# Patient Record
Sex: Male | Born: 1950
Health system: Southern US, Community
[De-identification: ages and names within clinical notes are randomized; demographics above are authoritative.]

## PROBLEM LIST (undated history)

## (undated) DIAGNOSIS — E7800 Pure hypercholesterolemia, unspecified: Secondary | ICD-10-CM

## (undated) DIAGNOSIS — C61 Malignant neoplasm of prostate: Secondary | ICD-10-CM

## (undated) DIAGNOSIS — H3530 Unspecified macular degeneration: Secondary | ICD-10-CM

## (undated) DIAGNOSIS — M1990 Unspecified osteoarthritis, unspecified site: Secondary | ICD-10-CM

## (undated) DIAGNOSIS — K759 Inflammatory liver disease, unspecified: Secondary | ICD-10-CM

## (undated) DIAGNOSIS — Z720 Tobacco use: Secondary | ICD-10-CM

## (undated) DIAGNOSIS — F419 Anxiety disorder, unspecified: Secondary | ICD-10-CM

## (undated) HISTORY — PX: OTHER SURGICAL HISTORY: SHX169

## (undated) HISTORY — PX: SHOULDER SURGERY: SHX246

## (undated) HISTORY — PX: EYE SURGERY: SHX253

## (undated) HISTORY — PX: APPENDECTOMY: SHX54

---

## 1998-06-20 ENCOUNTER — Ambulatory Visit (HOSPITAL_COMMUNITY): Admission: RE | Admit: 1998-06-20 | Discharge: 1998-06-20 | Payer: Self-pay | Admitting: Neurosurgery

## 1998-06-20 ENCOUNTER — Encounter: Payer: Self-pay | Admitting: Neurosurgery

## 1998-07-15 ENCOUNTER — Encounter: Payer: Self-pay | Admitting: Neurosurgery

## 1998-07-15 ENCOUNTER — Ambulatory Visit (HOSPITAL_COMMUNITY): Admission: RE | Admit: 1998-07-15 | Discharge: 1998-07-15 | Payer: Self-pay | Admitting: Neurosurgery

## 1998-07-29 ENCOUNTER — Ambulatory Visit (HOSPITAL_COMMUNITY): Admission: RE | Admit: 1998-07-29 | Discharge: 1998-07-29 | Payer: Self-pay | Admitting: Neurosurgery

## 1998-07-29 ENCOUNTER — Encounter: Payer: Self-pay | Admitting: Neurosurgery

## 1998-08-12 ENCOUNTER — Encounter: Payer: Self-pay | Admitting: Neurosurgery

## 1998-08-12 ENCOUNTER — Ambulatory Visit (HOSPITAL_COMMUNITY): Admission: RE | Admit: 1998-08-12 | Discharge: 1998-08-12 | Payer: Self-pay | Admitting: Neurosurgery

## 1998-10-01 ENCOUNTER — Ambulatory Visit (HOSPITAL_COMMUNITY): Admission: RE | Admit: 1998-10-01 | Discharge: 1998-11-25 | Payer: Self-pay | Admitting: Neurosurgery

## 1998-10-01 ENCOUNTER — Encounter: Payer: Self-pay | Admitting: Neurosurgery

## 1999-04-10 ENCOUNTER — Encounter: Payer: Self-pay | Admitting: Neurosurgery

## 1999-04-10 ENCOUNTER — Ambulatory Visit (HOSPITAL_COMMUNITY): Admission: RE | Admit: 1999-04-10 | Discharge: 1999-04-10 | Payer: Self-pay | Admitting: Neurosurgery

## 1999-05-20 ENCOUNTER — Encounter: Admission: RE | Admit: 1999-05-20 | Discharge: 1999-08-18 | Payer: Self-pay | Admitting: Neurosurgery

## 1999-06-24 ENCOUNTER — Ambulatory Visit (HOSPITAL_COMMUNITY): Admission: RE | Admit: 1999-06-24 | Discharge: 1999-06-24 | Payer: Self-pay | Admitting: Neurosurgery

## 1999-06-24 ENCOUNTER — Encounter: Payer: Self-pay | Admitting: Neurosurgery

## 2003-03-04 ENCOUNTER — Encounter: Payer: Self-pay | Admitting: Emergency Medicine

## 2003-03-04 ENCOUNTER — Emergency Department (HOSPITAL_COMMUNITY): Admission: EM | Admit: 2003-03-04 | Discharge: 2003-03-04 | Payer: Self-pay | Admitting: Emergency Medicine

## 2003-03-19 ENCOUNTER — Emergency Department (HOSPITAL_COMMUNITY): Admission: EM | Admit: 2003-03-19 | Discharge: 2003-03-19 | Payer: Self-pay | Admitting: Emergency Medicine

## 2003-03-19 ENCOUNTER — Encounter: Payer: Self-pay | Admitting: Emergency Medicine

## 2003-04-01 ENCOUNTER — Encounter: Payer: Self-pay | Admitting: Family Medicine

## 2003-04-01 ENCOUNTER — Ambulatory Visit (HOSPITAL_COMMUNITY): Admission: RE | Admit: 2003-04-01 | Discharge: 2003-04-01 | Payer: Self-pay | Admitting: Family Medicine

## 2010-05-07 ENCOUNTER — Ambulatory Visit (HOSPITAL_COMMUNITY): Admission: RE | Admit: 2010-05-07 | Discharge: 2010-05-07 | Payer: Self-pay | Admitting: Urology

## 2010-05-07 IMAGING — CT CT ABD-PEL WO/W CM
3 of 11 series · 12 of 46 positions shown, 18 images · IV contrast (Omnipaque 300)
Comparison: None.

CLINICAL DATA: Right flank and abdominal pain.  Elevated PSA.

CT ABDOMEN AND PELVIS WITHOUT AND WITH CONTRAST
TECHNIQUE: Multidetector CT imaging of the abdomen and pelvis was
performed without contrast material in one or both body regions,
followed by contrast material(s) and further sections in one or
both body regions.
Contrast: One and 25 ml [PR].

[Series 2: abd_pel_wo 5.0 b40f · axial · 0.74mm/px · z∈[-456,-106]mm · 7 of 94 slices shown, 12 images]
[im 12/94  soft-tissue]
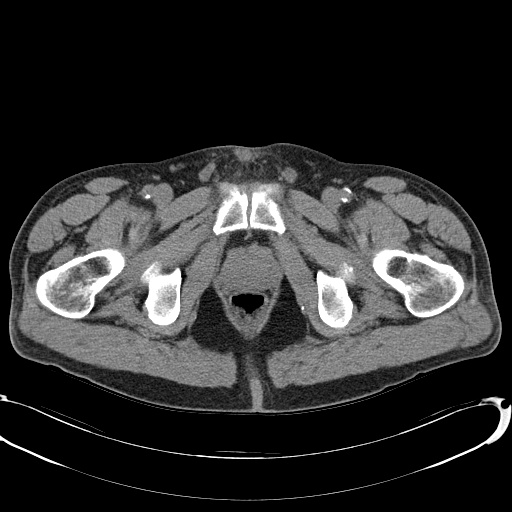
[im 12/94  bone]
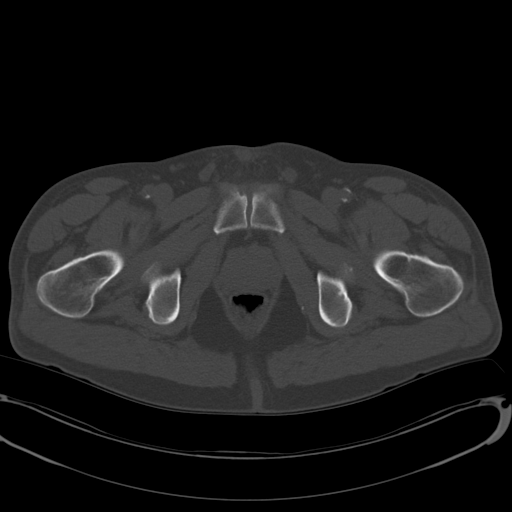
[im 24/94  soft-tissue]
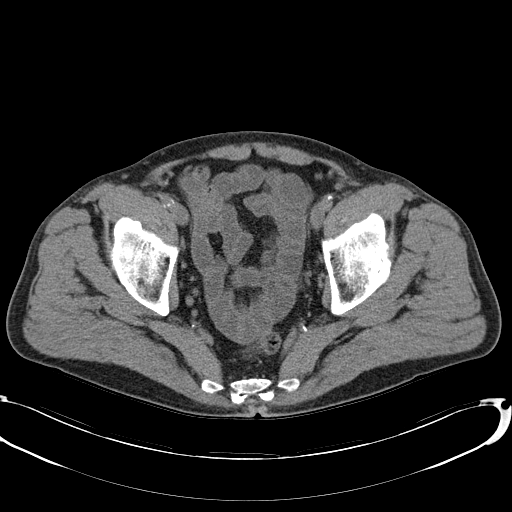
[im 35/94  soft-tissue]
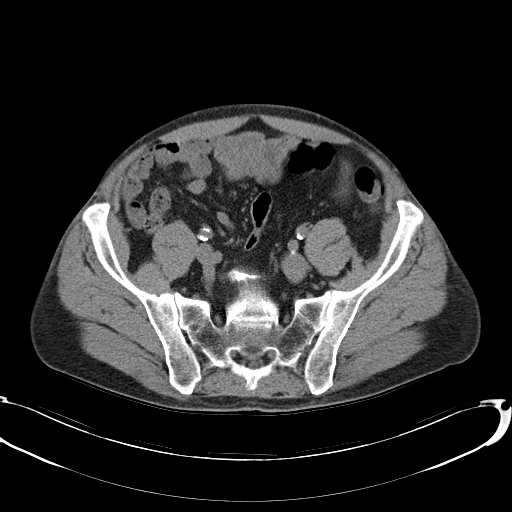
[im 47/94  soft-tissue]
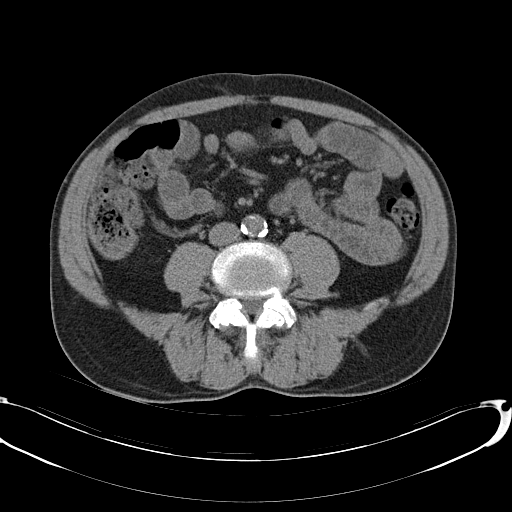
[im 47/94  lung]
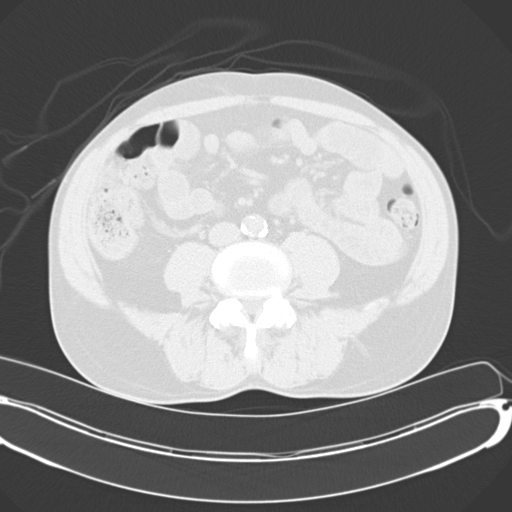
[im 59/94  soft-tissue]
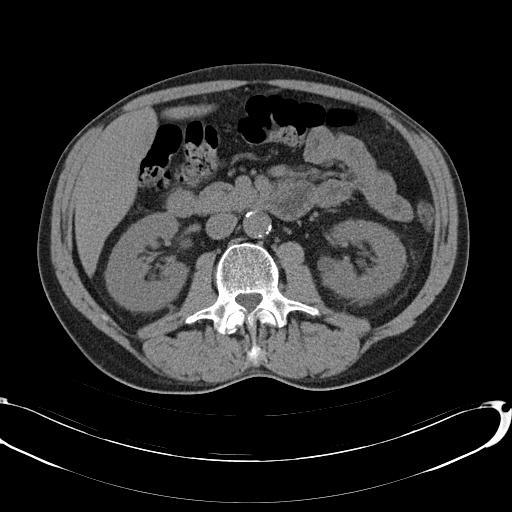
[im 59/94  lung]
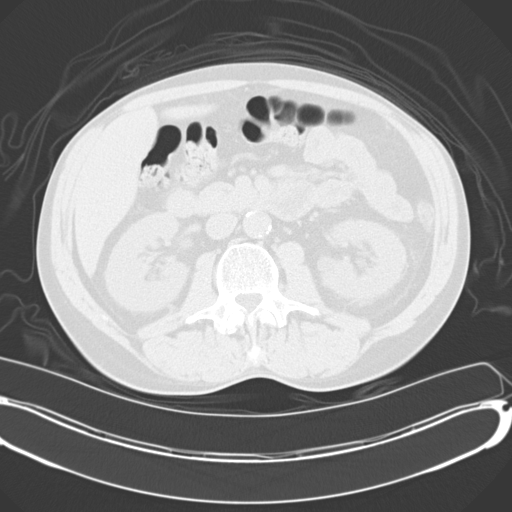
[im 70/94  soft-tissue]
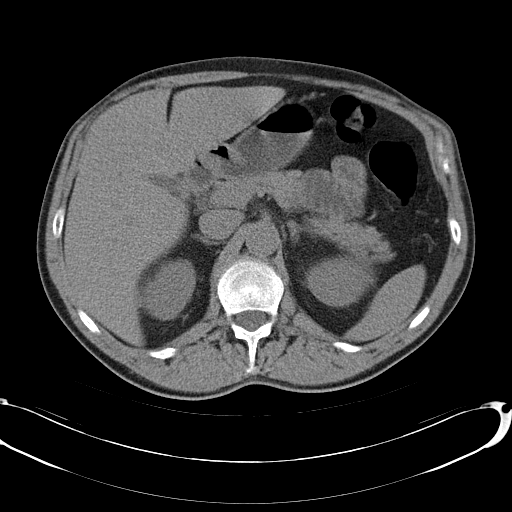
[im 70/94  lung]
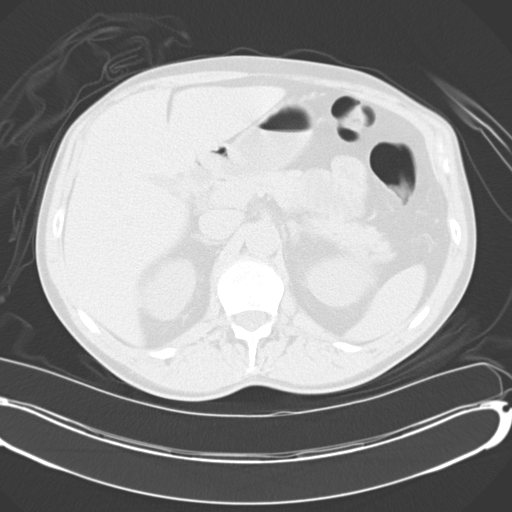
[im 82/94  soft-tissue]
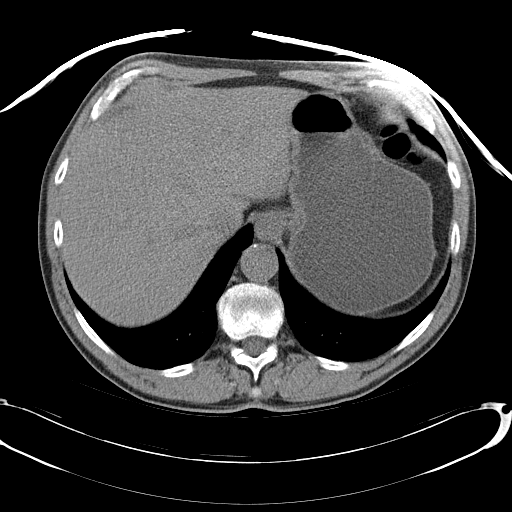
[im 82/94  lung]
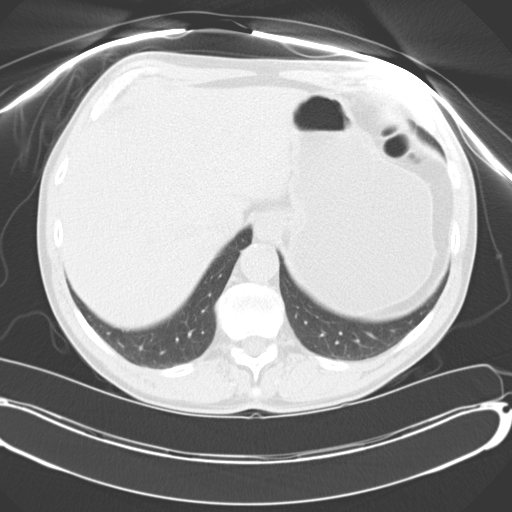

[Series 3: mpr pre contrast coronal 3.0 · coronal · non-contrast · 0.71mm/px · 2 of 83 slices shown, 3 images]
[im 28/83  soft-tissue]
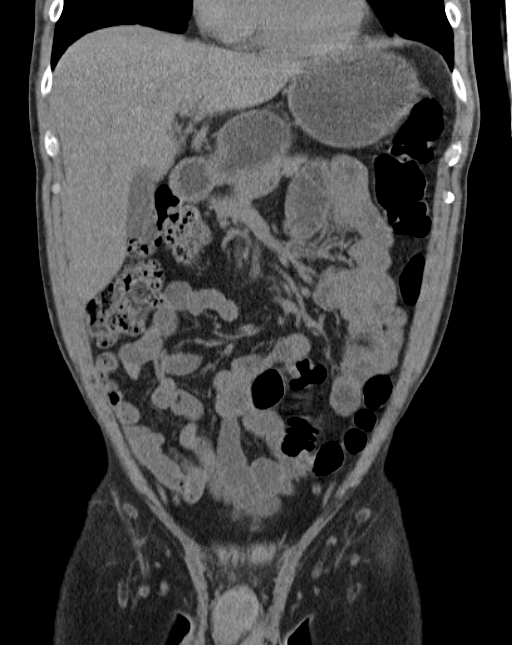
[im 28/83  bone]
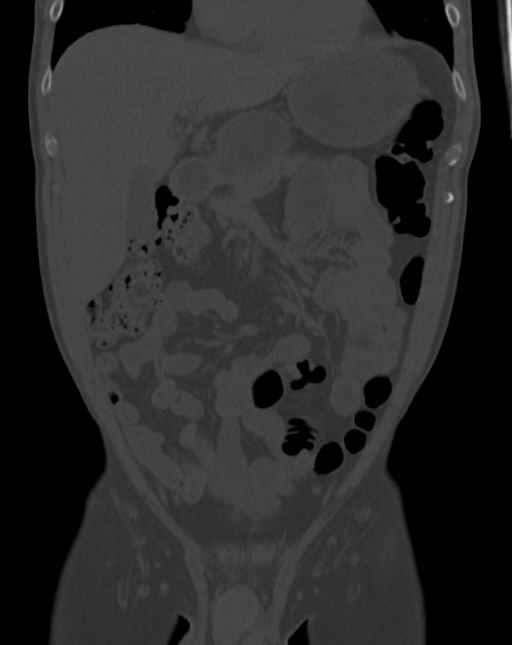
[im 55/83  soft-tissue]
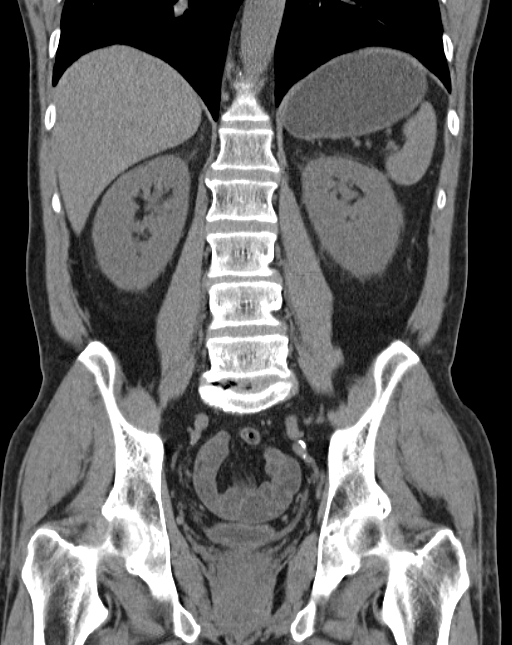

[Series 5: abd_pel_with 5.0 b40f · axial · 0.74mm/px · z∈[-449,-329]mm · 3 of 97 slices shown]
[im 13/97  soft-tissue]
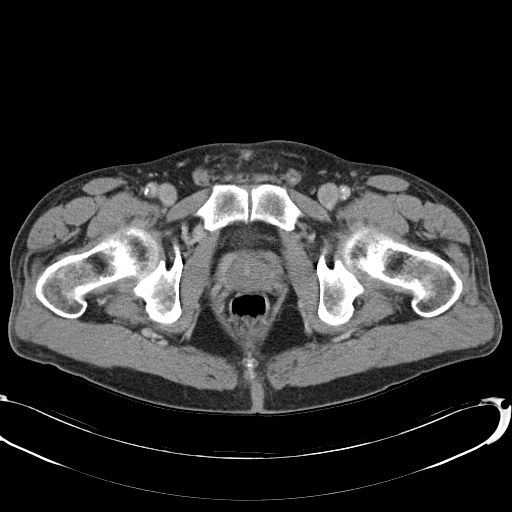
[im 25/97  soft-tissue]
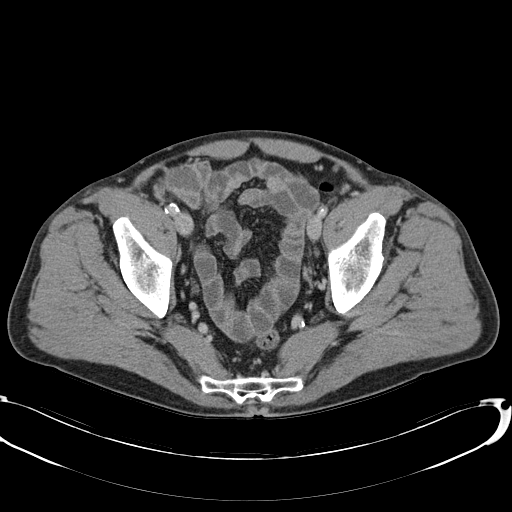
[im 37/97  soft-tissue]
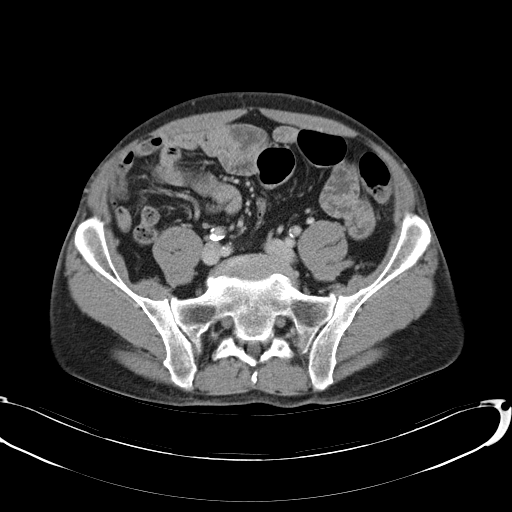

[12 of 46 positions shown; findings below may reference images not displayed]

FINDINGS: Mild dependent atelectasis is seen in the lung bases.  No
pleural or pericardial effusion.

There are no renal or ureteral stones and no hydronephrosis.  The
patient has three small left  renal cysts and a small right renal
cyst.  The kidneys otherwise appear normal.  There is good
opacification of the ureters which also appear normal.

The gallbladder, spleen, adrenal glands and pancreas appear normal.
A 0.5 cm hypoattenuating lesion in the right lobe of the liver is
compatible with a small cyst.  The liver is otherwise normal
appearance.

The stomach and small and large bowel are normal in appearance.
Urinary bladder and prostate gland appear normal.  There is no
lymphadenopathy or fluid.  No focal bony abnormality.
IMPRESSION: No acute finding.  Negative for urinary tract stones.  Small
bilateral renal cysts are noted.  The examination is otherwise
unremarkable.

## 2012-12-31 ENCOUNTER — Observation Stay (HOSPITAL_COMMUNITY)
Admission: EM | Admit: 2012-12-31 | Discharge: 2013-01-01 | Disposition: A | Discharge status: Home / Self Care | Payer: 59 | Attending: Internal Medicine | Admitting: Internal Medicine

## 2012-12-31 ENCOUNTER — Emergency Department (HOSPITAL_COMMUNITY): Payer: 59

## 2012-12-31 ENCOUNTER — Encounter (HOSPITAL_COMMUNITY): Payer: Self-pay | Admitting: *Deleted

## 2012-12-31 DIAGNOSIS — F4320 Adjustment disorder, unspecified: Secondary | ICD-10-CM | POA: Diagnosis present

## 2012-12-31 DIAGNOSIS — F17200 Nicotine dependence, unspecified, uncomplicated: Secondary | ICD-10-CM | POA: Insufficient documentation

## 2012-12-31 DIAGNOSIS — R079 Chest pain, unspecified: Principal | ICD-10-CM | POA: Diagnosis present

## 2012-12-31 DIAGNOSIS — R739 Hyperglycemia, unspecified: Secondary | ICD-10-CM | POA: Diagnosis present

## 2012-12-31 DIAGNOSIS — Z720 Tobacco use: Secondary | ICD-10-CM | POA: Diagnosis present

## 2012-12-31 DIAGNOSIS — R7309 Other abnormal glucose: Secondary | ICD-10-CM | POA: Insufficient documentation

## 2012-12-31 DIAGNOSIS — F4321 Adjustment disorder with depressed mood: Secondary | ICD-10-CM | POA: Insufficient documentation

## 2012-12-31 HISTORY — DX: Tobacco use: Z72.0

## 2012-12-31 LAB — BASIC METABOLIC PANEL
BUN: 9 mg/dL (ref 6–23)
Chloride: 96 mEq/L (ref 96–112)
GFR calc Af Amer: >90 mL/min (ref >90)
GFR calc non Af Amer: >90 mL/min (ref >90)
Potassium: 3.5 mEq/L (ref 3.5–5.1)
Sodium: 134 mEq/L — ABNORMAL LOW (ref 135–145)

## 2012-12-31 LAB — APTT: aPTT: 31 seconds (ref 24–37)

## 2012-12-31 LAB — TROPONIN I: Troponin I: <0.3 ng/mL (ref <0.30)

## 2012-12-31 LAB — CBC WITH DIFFERENTIAL/PLATELET
Basophils Relative: 0 % (ref 0–1)
Eosinophils Absolute: 0.3 10*3/uL (ref 0.0–0.7)
Eosinophils Relative: 3 % (ref 0–5)
Hemoglobin: 15.3 g/dL (ref 13.0–17.0)
Lymphocytes Relative: 28 % (ref 12–46)
Monocytes Absolute: 0.7 10*3/uL (ref 0.1–1.0)
Neutrophils Relative %: 62 % (ref 43–77)
Platelets: 266 10*3/uL (ref 150–400)
RBC: 4.54 MIL/uL (ref 4.22–5.81)

## 2012-12-31 LAB — PROTIME-INR
INR: 0.96 (ref 0.00–1.49)
Prothrombin Time: 12.7 seconds (ref 11.6–15.2)

## 2012-12-31 IMAGING — DX DG CHEST 1V PORT
1 series · 1 of 1 positions shown · non-contrast
Comparison: None.

CLINICAL DATA: Chest pain and shortness of breath.

PORTABLE CHEST - 1 VIEW

[portable]
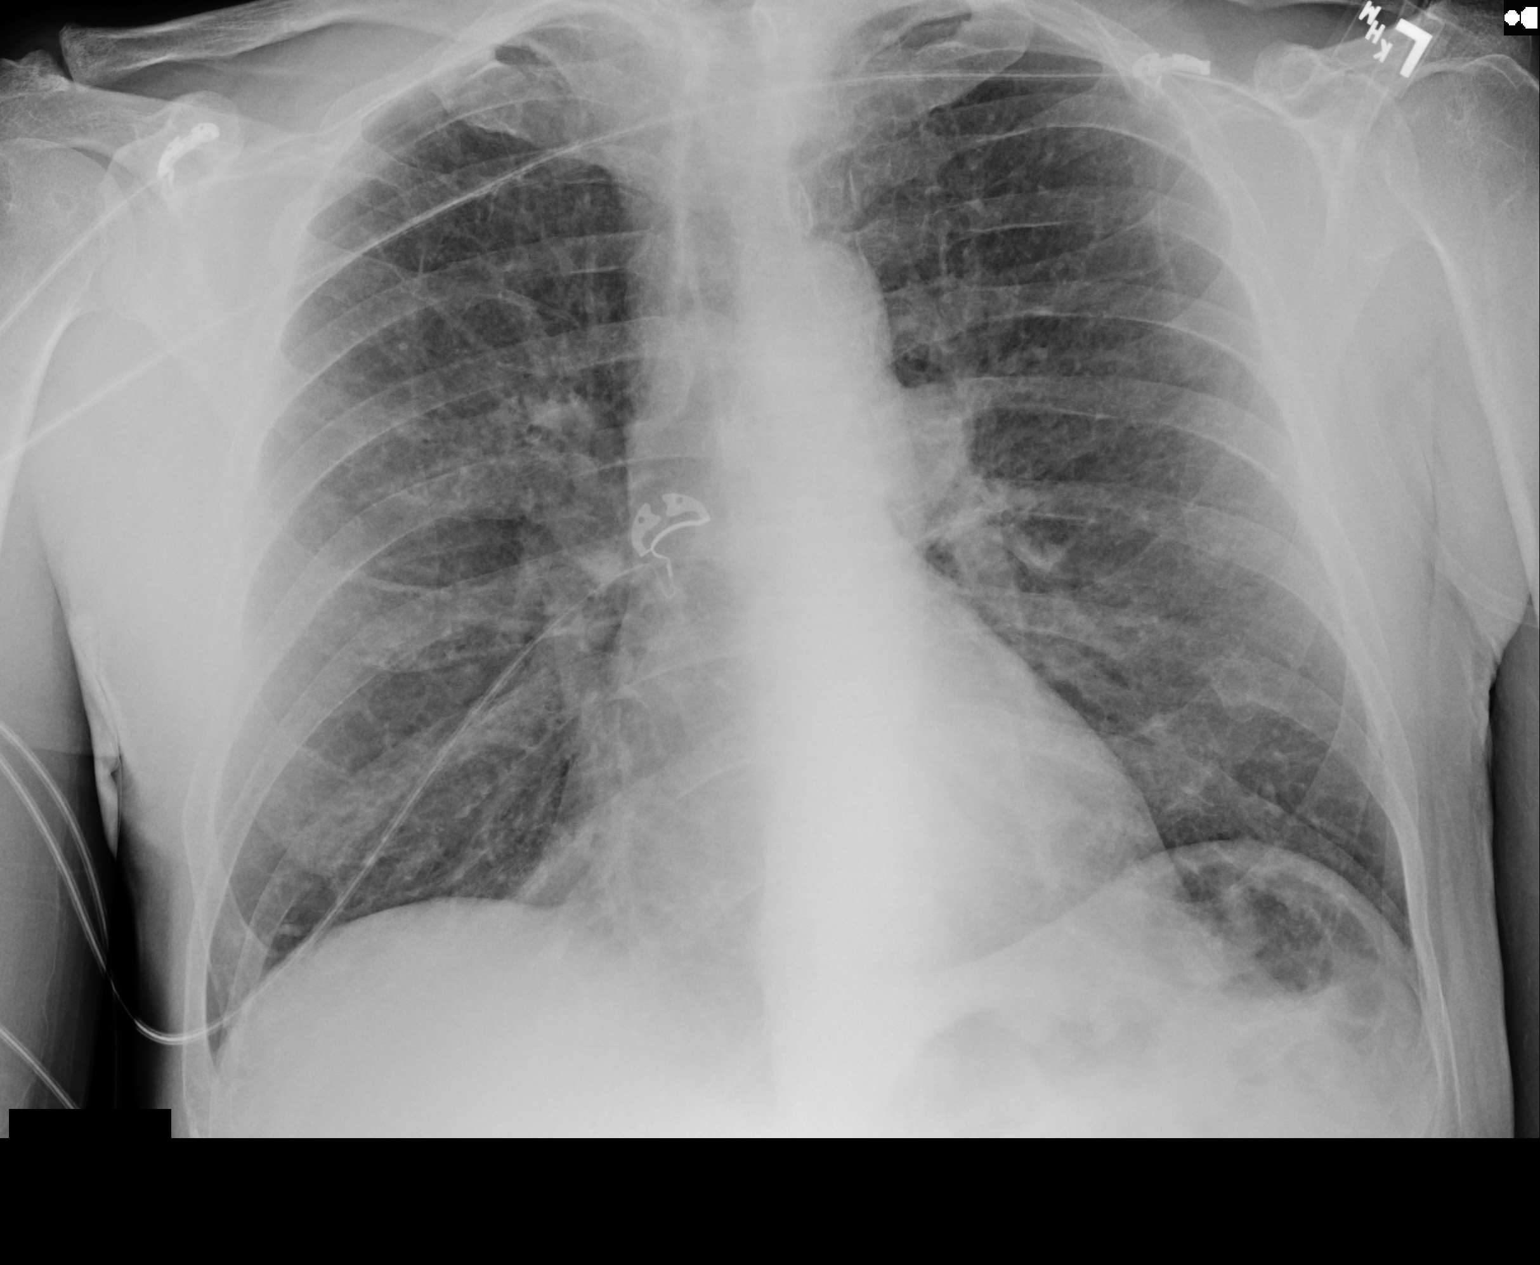

[1 of 1 positions shown; findings below may reference images not displayed]

FINDINGS: Diffuse interstitial prominence likely reflects chronic
lung disease.  No evidence of edema, pulmonary consolidation or
pleural effusion.  Heart size and mediastinal contours are within
normal limits.
IMPRESSION: No acute findings.  Diffuse interstitial prominence likely reflects
component of chronic lung disease.

## 2012-12-31 MED ORDER — ACETAMINOPHEN 325 MG PO TABS
650.0000 mg | ORAL_TABLET | Freq: Four times a day (QID) | ORAL | Status: DC | PRN
  Administered 2012-12-31: 650 mg via ORAL
  Filled 2012-12-31: qty 2

## 2012-12-31 MED ORDER — ONDANSETRON HCL 4 MG/2ML IJ SOLN: 4.0000 mg | Freq: Four times a day (QID) | INTRAMUSCULAR | Status: DC | PRN

## 2012-12-31 MED ORDER — ALBUTEROL SULFATE (5 MG/ML) 0.5% IN NEBU: 2.5000 mg | INHALATION_SOLUTION | RESPIRATORY_TRACT | Status: DC | PRN

## 2012-12-31 MED ORDER — ENOXAPARIN SODIUM 100 MG/ML ~~LOC~~ SOLN
1.0000 mg/kg | Freq: Once | SUBCUTANEOUS | Status: AC
  Administered 2012-12-31: 75 mg via SUBCUTANEOUS
  Filled 2012-12-31: qty 1

## 2012-12-31 MED ORDER — ASPIRIN EC 81 MG PO TBEC
81.0000 mg | DELAYED_RELEASE_TABLET | Freq: Every day | ORAL | Status: DC
  Administered 2013-01-01: 81 mg via ORAL
  Filled 2012-12-31: qty 1

## 2012-12-31 MED ORDER — ENOXAPARIN SODIUM 40 MG/0.4ML ~~LOC~~ SOLN
40.0000 mg | SUBCUTANEOUS | Status: DC
  Administered 2012-12-31: 40 mg via SUBCUTANEOUS
  Filled 2012-12-31: qty 0.4

## 2012-12-31 MED ORDER — NICOTINE 21 MG/24HR TD PT24
21.0000 mg | MEDICATED_PATCH | Freq: Every day | TRANSDERMAL | Status: DC
  Administered 2012-12-31 – 2013-01-01 (×2): 21 mg via TRANSDERMAL
  Filled 2012-12-31 (×2): qty 1

## 2012-12-31 MED ORDER — GUAIFENESIN-DM 100-10 MG/5ML PO SYRP: 5.0000 mL | ORAL_SOLUTION | ORAL | Status: DC | PRN

## 2012-12-31 MED ORDER — NITROGLYCERIN 2 % TD OINT
0.5000 [in_us] | TOPICAL_OINTMENT | Freq: Four times a day (QID) | TRANSDERMAL | Status: DC
  Administered 2012-12-31 – 2013-01-01 (×3): 0.5 [in_us] via TOPICAL
  Filled 2012-12-31 (×3): qty 1

## 2012-12-31 MED ORDER — ASPIRIN 325 MG PO TABS
325.0000 mg | ORAL_TABLET | Freq: Once | ORAL | Status: AC
  Administered 2012-12-31: 325 mg via ORAL
  Filled 2012-12-31: qty 1

## 2012-12-31 MED ORDER — ALPRAZOLAM 0.5 MG PO TABS
0.5000 mg | ORAL_TABLET | Freq: Three times a day (TID) | ORAL | Status: DC | PRN
  Administered 2012-12-31 (×2): 0.5 mg via ORAL
  Filled 2012-12-31 (×2): qty 1

## 2012-12-31 MED ORDER — HYDROMORPHONE HCL PF 1 MG/ML IJ SOLN: 0.5000 mg | INTRAMUSCULAR | Status: DC | PRN

## 2012-12-31 MED ORDER — ACETAMINOPHEN 650 MG RE SUPP: 650.0000 mg | Freq: Four times a day (QID) | RECTAL | Status: DC | PRN

## 2012-12-31 MED ORDER — MORPHINE SULFATE 4 MG/ML IJ SOLN
6.0000 mg | Freq: Once | INTRAMUSCULAR | Status: AC
  Administered 2012-12-31: 6 mg via INTRAVENOUS
  Filled 2012-12-31: qty 2

## 2012-12-31 MED ORDER — ONDANSETRON HCL 4 MG PO TABS: 4.0000 mg | ORAL_TABLET | Freq: Four times a day (QID) | ORAL | Status: DC | PRN

## 2012-12-31 MED ORDER — POTASSIUM CHLORIDE IN NACL 20-0.9 MEQ/L-% IV SOLN
INTRAVENOUS | Status: DC
  Administered 2012-12-31 – 2013-01-01 (×2): via INTRAVENOUS
  Filled 2012-12-31: qty 1000

## 2012-12-31 MED ORDER — FAMOTIDINE 20 MG PO TABS
20.0000 mg | ORAL_TABLET | Freq: Two times a day (BID) | ORAL | Status: DC
  Administered 2013-01-01: 20 mg via ORAL
  Filled 2012-12-31 (×2): qty 1

## 2012-12-31 MED ORDER — NITROGLYCERIN 2 % TD OINT
0.5000 [in_us] | TOPICAL_OINTMENT | Freq: Four times a day (QID) | TRANSDERMAL | Status: DC
  Administered 2012-12-31: 0.5 [in_us] via TOPICAL
  Filled 2012-12-31: qty 1

## 2012-12-31 MED ORDER — NITROGLYCERIN 0.4 MG SL SUBL
0.4000 mg | SUBLINGUAL_TABLET | SUBLINGUAL | Status: DC | PRN
  Administered 2012-12-31 (×3): 0.4 mg via SUBLINGUAL
  Filled 2012-12-31: qty 25

## 2012-12-31 MED ORDER — ALUM & MAG HYDROXIDE-SIMETH 200-200-20 MG/5ML PO SUSP: 30.0000 mL | Freq: Four times a day (QID) | ORAL | Status: DC | PRN

## 2012-12-31 NOTE — H&P (Signed)
[facility] History and Physical  [name]:[id number] DOB: 1950/10/06 DOA: 12/31/2012  Referring physician: Dr. Patria Mane PCP: Cassell Smiles., MD  Specialists: None  Chief Complaint: Chest pain  HPI: David White is a 62 y.o. male with no significant past medical history, who presents to the emergency department today with a chief complaint of chest pain. His chest pain has been intermittent and has been occurring for at least 2-3 months. It was worse yesterday and this morning. It is located in the mid chest to the left chest. He describes the pain as tightness and heaviness. At its worse, is an 8/10 in intensity. It is intermittent with rest and with activity. Nothing makes the pain better or worse. It generally resolves spontaneously. He has taken aspirin on a few occasions, but he is not sure if it helped to relieve the pain or if the pain was relieved on its own. He has had occasional shortness of breath, occasional radiation to both sides of his jaw, and intermittent nausea. He has had some cramping in his calf muscles, but no swelling in his legs. He denies heavy lifting. He denies pleurisy. He acknowledges some depression and stress as his wife suddenly and unexpectedly died approximately 3 weeks ago of a massive heart attack.  In the emergency department, the patient is afebrile and hemodynamically stable. He was given 3 nitroglycerin sublingually which decreased his pain from 10 over 10-3/10. His cardiac enzymes are within normal limits. His chest x-ray reveals changes consistent with chronic lung disease, but no acute findings. His EKG reveals normal sinus rhythm with a heart rate of 87 beats per minute and no ST or T wave abnormalities. His venous glucose is slightly elevated at 132. He is being admitted for further evaluation and management.    Review of Systems: As above in history present illness. He has been tearful, sad, and with some fatigue. He has had some  depression but denies suicidal ideation.  Past Medical History  Diagnosis Date  . Tobacco abuse    Past Surgical History  Procedure Laterality Date  . Appendectomy     Social History: He was recently widowed 3 weeks ago. He has one son. He is employed in mechanical obstruction. He smokes 2 packs of cigarettes per day and has been doing so for 45 years. He drinks 3-6 beers daily. He denies any history of alcohol withdrawal symptoms. He denies illicit drug use.   Allergies  Allergen Reactions  . Codeine     Headache dizziness   Family history: His mother died of some type of intestinal disorder. His father died of a heart attack. He also had some type of cancer.  Prior to Admission medications   Not on File   Physical Exam: Filed Vitals:   12/31/12 1038 12/31/12 1100 12/31/12 1200 12/31/12 1331  BP: 130/69 118/69 127/67 119/72  Pulse:  82 74 84  Temp:      TempSrc:      Resp: 16 10 7 16   Height:      Weight:      SpO2: 95% 95% 97% 93%     General:  Alert 62 year old Caucasian man sitting up in bed, in no acute distress.  Eyes: Pupils equal, round, and reactive to light. Extraocular movements are intact. Conjunctivae are mildly injected. Sclerae are white.  ENT: Oropharynx reveals mildly dry mucous membranes. No posterior exudates or edema. Nasal mucosa is dry.  Neck: Supple, no adenopathy, no thyromegaly, no JVD.  Cardiovascular:  S1, S2, with no murmurs rubs or gallops.  Respiratory: Occasional crackles auscultated bilaterally, breathing nonlabored.  Abdomen: Positive bowel sounds, soft, nontender, nondistended.  Skin: Good turgor. No rashes.  Musculoskeletal: No acute hot joints. Pedal pulses palpable. No calf tenderness. No edema of the lower extremities bilaterally. No erythema or warmth.  Psychiatric: Sad affect. He began to cry when he was asked about his wife. Speech is clear otherwise. Cooperative otherwise.  Neurologic: Alert and oriented x3. Cranial  nerves II through XII are intact.  Labs on Admission:  Basic Metabolic Panel:  Recent Labs Lab 12/31/12 0940  NA 134*  K 3.5  CL 96  CO2 23  GLUCOSE 132*  BUN 9  CREATININE 0.65  CALCIUM 9.3   Liver Function Tests: No results found for this basename: AST, ALT, ALKPHOS, BILITOT, PROT, ALBUMIN,  in the last 168 hours No results found for this basename: LIPASE, AMYLASE,  in the last 168 hours No results found for this basename: AMMONIA,  in the last 168 hours CBC:  Recent Labs Lab 12/31/12 0940  WBC 10.1  NEUTROABS 6.3  HGB 15.3  HCT 43.2  MCV 95.2  PLT 266   Cardiac Enzymes:  Recent Labs Lab 12/31/12 0940 12/31/12 1133  TROPONINI <0.30 <0.30    BNP (last 3 results) No results found for this basename: PROBNP,  in the last 8760 hours CBG: No results found for this basename: GLUCAP,  in the last 168 hours  Radiological Exams on Admission: Dg Chest Portable 1 View  12/31/2012   *RADIOLOGY REPORT*  Clinical Data: Chest pain and shortness of breath.  PORTABLE CHEST - 1 VIEW  Comparison: None.  Findings: Diffuse interstitial prominence likely reflects chronic lung disease.  No evidence of edema, pulmonary consolidation or pleural effusion.  Heart size and mediastinal contours are within normal limits.  IMPRESSION: No acute findings.  Diffuse interstitial prominence likely reflects component of chronic lung disease.   Original Report Authenticated By: Irish Lack, M.D.    EKG: As above in history present illness.  Assessment/Plan Principal Problem:   Chest pain Active Problems:   Grief reaction   Tobacco abuse   Hyperglycemia   1. This is a 62 year old man who presents with chest pain. He has no history of heart disease, hypertension, hyperlipidemia, or diabetes. He does have risk factors for coronary artery disease including tobacco abuse, male gender, and family history. His troponin I. is negative x2. His EKG reveals no acute findings. He was given 3  sublingual nitroglycerin which decreased his pain to 3/10. Not sure if nitroglycerin was actually the reason why his pain decreased. His negative troponin I and EKG are reassuring. His blood glucose is mildly elevated, nonfasting. He is obviously undergoing grief with the recent unexpected death of his wife who was only 48 years old. Apparently, she died of a massive heart attack.    Plan: 1. Will admit the patient for 24 hour observation. 2. We'll continue nitroglycerin ointment as already ordered. We'll continue aspirin therapy once daily. We'll continue oxygen. Will add IV hydromorphone as needed for pain. 3. We'll start H2 blocker with Pepcid. 4. We'll start Xanax as needed for anxiousness. We'll place a nicotine patch. We'll order tobacco cessation counseling. 5. Will start gentle IV fluids. 6. We'll consult the chaplain for counseling. 7. For further evaluation, we'll order cardiac enzymes, followup EKG in the morning, TSH, and hemoglobin A1c.    Code Status: Full code Family Communication: Discussed with son Disposition Plan: Anticipate discharge  to home in 24 hours.  Time spent: One hour.  [facility] Pager [phone]  If 7PM-7AM, please contact night-coverage [email or web address] Password [facility] 12/31/2012, 1:40 PM

## 2012-12-31 NOTE — ED Notes (Signed)
Pt c/o mid center chest pain that radiates to left side of chest area and to left side jaw area, pain is described as a pressure, remains constant, admits to sob, nausea. Pain started last night,

## 2012-12-31 NOTE — ED Provider Notes (Signed)
History    This chart was scribed for David Co, MD by Leone Payor, ED Scribe. This patient was seen in room APA14/APA14 and the patient's care was started 9:45 AM.   CSN: [id number]  Arrival date & time 12/31/12  0931   First MD Initiated Contact with Patient 12/31/12 [phone]      Chief Complaint  Patient presents with  . Chest Pain     The history is provided by the patient. No language interpreter was used.    HPI Comments: David White is a 62 y.o. male who presents to the Emergency Department complaining of intermittent, mid center chest pain with associated L jaw discomfort starting 3-4 months ago. States the pain would last a couple of hours at a time and would cause him to become SOB. His latest episode started last night at 10 pm and has been constant since onset. He has associated SOB currently and 1 episode of vomiting this morning. He took a full sized aspirin yesterday. He describes the pain as sharp but denies radiation other than discomfort in the jaw.  He denies h/o similar symptoms. Denies h/o HTN, HLD, DM. Has family h/o heart disease (father had MI at 39). He denies black or bloody stools.   Pt is a current everyday smoker and occasional alcohol user.    History reviewed. No pertinent past medical history.  Past Surgical History  Procedure Laterality Date  . Appendectomy      No family history on file.  History  Substance Use Topics  . Smoking status: Current Every Day Smoker  . Smokeless tobacco: Not on file  . Alcohol Use: Yes     Comment: few beers      Review of Systems A complete 10 system review of systems was obtained and all systems are negative except as noted in the HPI and PMH.   Allergies  Codeine  Home Medications  No current outpatient prescriptions on file.  BP 130/69  Pulse 85  Temp(Src) 97.7 F (36.5 C) (Oral)  Resp 16  Ht 5\' 10"  (1.778 m)  Wt 164 lb (74.39 kg)  BMI 23.53 kg/m2  SpO2 95%  Physical Exam  Nursing note  and vitals reviewed. Constitutional: He is oriented to person, place, and time. He appears well-developed and well-nourished.  HENT:  Head: Normocephalic and atraumatic.  Eyes: EOM are normal.  Neck: Normal range of motion.  Cardiovascular: Normal rate, regular rhythm, normal heart sounds and intact distal pulses.   Pulmonary/Chest: Effort normal and breath sounds normal. No respiratory distress.  Abdominal: Soft. He exhibits no distension. There is no tenderness.  Genitourinary: Rectum normal.  Musculoskeletal: Normal range of motion.  Neurological: He is alert and oriented to person, place, and time.  Skin: Skin is warm and dry.  Psychiatric: He has a normal mood and affect. Judgment normal.    ED Course  Procedures (including critical care time)   Date: 12/31/2012  Rate: 87  Rhythm: normal sinus rhythm  QRS Axis: normal  Intervals: normal  ST/T Wave abnormalities: normal  Conduction Disutrbances: none  Narrative Interpretation:   Old EKG Reviewed: No significant changes noted     DIAGNOSTIC STUDIES: Oxygen Saturation is 98% on RA, normal by my interpretation.    COORDINATION OF CARE: 10:00 AM Discussed treatment plan with pt at bedside and pt agreed to plan.   Labs Reviewed  BASIC METABOLIC PANEL - Abnormal; Notable for the following:    Sodium 134 (*)  Glucose, Bld 132 (*)    All other components within normal limits  CBC WITH DIFFERENTIAL  TROPONIN I  PROTIME-INR  APTT  TROPONIN I  D-DIMER, QUANTITATIVE  HEMOGLOBIN A1C  TSH  TROPONIN I  TROPONIN I   Dg Chest Portable 1 View  12/31/2012   *RADIOLOGY REPORT*  Clinical Data: Chest pain and shortness of breath.  PORTABLE CHEST - 1 VIEW  Comparison: None.  Findings: Diffuse interstitial prominence likely reflects chronic lung disease.  No evidence of edema, pulmonary consolidation or pleural effusion.  Heart size and mediastinal contours are within normal limits.  IMPRESSION: No acute findings.  Diffuse  interstitial prominence likely reflects component of chronic lung disease.   Original Report Authenticated By: Irish Lack, M.D.   I personally reviewed the imaging tests through PACS system I reviewed available ER/hospitalization records through the EMR   1. Chest pain       MDM  Concerning story for his chest pain given anterior chest pain some radiation to his right jaw.  Patient pain-free after 3 nitroglycerin.  Aspirin given.  EKG and troponin are normal.  The patient will need inpatient evaluation for additional risk stratification likely stress test in the hospital.      I personally performed the services described in this documentation, which was scribed in my presence. The recorded information has been reviewed and is accurate.      David Co, MD 12/31/12 [phone]

## 2012-12-31 NOTE — ED Notes (Signed)
Pt now states that the pain is described as sharp and it comes and goes.

## 2013-01-01 LAB — CBC
MCH: 32.9 pg (ref 26.0–34.0)
MCV: 96.3 fL (ref 78.0–100.0)
Platelets: 249 10*3/uL (ref 150–400)
RBC: 4.32 MIL/uL (ref 4.22–5.81)
RDW: 13.3 % (ref 11.5–15.5)

## 2013-01-01 LAB — COMPREHENSIVE METABOLIC PANEL
AST: 17 U/L (ref 0–37)
Albumin: 3.2 g/dL — ABNORMAL LOW (ref 3.5–5.2)
Alkaline Phosphatase: 108 U/L (ref 39–117)
Chloride: 104 mEq/L (ref 96–112)
Potassium: 4.1 mEq/L (ref 3.5–5.1)
Sodium: 138 mEq/L (ref 135–145)
Total Bilirubin: 0.3 mg/dL (ref 0.3–1.2)

## 2013-01-01 LAB — TROPONIN I: Troponin I: <0.3 ng/mL (ref <0.30)

## 2013-01-01 MED ORDER — HYDROCODONE-ACETAMINOPHEN 5-325 MG PO TABS: 1.0000 | ORAL_TABLET | Freq: Four times a day (QID) | ORAL | Status: DC | PRN

## 2013-01-01 MED ORDER — ALPRAZOLAM 0.5 MG PO TABS: 0.5000 mg | ORAL_TABLET | Freq: Three times a day (TID) | ORAL | Status: AC | PRN

## 2013-01-01 MED ORDER — FAMOTIDINE 10 MG PO TABS: 20.0000 mg | ORAL_TABLET | Freq: Every day | ORAL | Status: DC

## 2013-01-01 MED ORDER — OXYCODONE-ACETAMINOPHEN 5-325 MG PO TABS: 1.0000 | ORAL_TABLET | ORAL | Status: DC | PRN

## 2013-01-01 MED ORDER — ENOXAPARIN SODIUM 80 MG/0.8ML ~~LOC~~ SOLN: 80.0000 mg | SUBCUTANEOUS | Status: DC

## 2013-01-01 NOTE — Discharge Summary (Signed)
Physician Discharge Summary  David White WUJ:[id number] DOB: 17-Aug-1950 DOA: 12/31/2012  PCP: Cassell Smiles., MD  Admit date: 12/31/2012 Discharge date: 01/01/2013  Time spent: Greater than 30 minutes  Recommendations for Outpatient Follow-up:  1. An appointment was made for the patient to followup with [facility] cardiology as a new patient.  Discharge Diagnoses:  Principal Problem:   Chest pain Active Problems:   Grief reaction   Tobacco abuse   Hyperglycemia     Discharge Condition: Improved.   Diet recommendation: Heart healthy  Filed Weights   12/31/12 0941 12/31/12 1422  Weight: 74.39 kg (164 lb) 157 kg (346 lb 2 oz)    History of present illness:  The patient is a 62 year old man with no significant past medical history, who presented to the emergency department on 12/31/2012 with a chief complaint of chest pain. He acknowledged some depression and stress as his wife suddenly and unexpectedly died approximately 3 weeks ago of a massive heart attack. In the emergency department, he was afebrile and hemodynamically stable. He was given 3 sublingual nitroglycerin which decreased his pain from 10 /10-3/10. His cardiac enzymes were within normal limits. His chest x-ray revealed chronic lung changes, but no acute findings. His EKG revealed normal sinus rhythm with a heart rate of 87 beats per minute and no ST or T wave abnormalities. His glucose was slightly elevated at 132, nonfasting. He was admitted for further evaluation and management.  Hospital Course:   Nitroglycerin ointment was administered in the emergency department. He was continued on this. He was given aspirin in the emergency department as well. This was continued. In addition, as needed morphine was ordered for pain. Pepcid was given empirically for H2 blockade. As needed Xanax was ordered as the patient appeared to be undergoing a grief reaction. He started crying rather heavily when I had asked him about his wife.  Nicotine replacement therapy was started with a nicotine patch. He was strongly advised to stop smoking.  For further evaluation, a number of laboratory studies were ordered. His cardiac enzymes were well within normal limits. His venous glucose normalized. His hemoglobin A1c was normal at 5.4. His TSH was within normal limits at 0.8. His followup EKG revealed normal sinus rhythm early polarization, but no other abnormalities.  The chaplain was consulted for spiritual counseling.  The patient's pain subsided, but did not completely go away. His pain appeared to be atypical on followup assessment. His normal troponin I and EKG were reassuring. Nevertheless, because of his risk factors, an outpatient appointment was made for him to followup with [facility] cardiology. He was discharged to home on as needed Percocet and Xanax and Pepcid.  Procedures:  None  Consultations:  Chaplain.  Discharge Exam: Filed Vitals:   12/31/12 1331 12/31/12 1422 12/31/12 2055 01/01/13 0459  BP: 119/72 132/70 101/65 119/76  Pulse: 84 74 80 70  Temp:  97.7 F (36.5 C) 98.1 F (36.7 C) 98.6 F (37 C)  TempSrc:  Oral Oral Oral  Resp: 16 16 18 18   Height:  5\' 10"  (1.778 m)    Weight:  157 kg (346 lb 2 oz)    SpO2: 93% 96% 91% 96%    General: Pleasant alert 62 year old Caucasian man in no acute distress. Cardiovascular: S1, S2, with no murmurs rubs or gallops. Respiratory: Coarse breath sounds in the bases, otherwise clear.  Discharge Instructions  Discharge Orders   Future Appointments Provider Department Dept Phone   01/22/2013 1:20 PM Laqueta Linden, MD [place]  [facility] [phone]   Future Orders Complete By Expires     Diet - low sodium heart healthy  As directed     Discharge instructions  As directed     Comments:      Try to stop smoking.    Increase activity slowly  As directed         Medication List    TAKE these medications       ALPRAZolam 0.5 MG tablet  Commonly  known as:  XANAX  Take 1 tablet (0.5 mg total) by mouth 3 (three) times daily as needed for anxiety or sleep.     famotidine 10 MG tablet  Commonly known as:  PEPCID  Take 2 tablets (20 mg total) by mouth daily.     oxyCODONE-acetaminophen 5-325 MG per tablet  Commonly known as:  ROXICET  Take 1 tablet by mouth every 4 (four) hours as needed for pain.       Allergies  Allergen Reactions  . Codeine     Headache dizziness       Follow-up Information   Follow up with Laqueta Linden, MD On 01/22/2013. (Cardiologist appointment at 1:20 PM)    Contact information:   [address] [place] [address] [phone]        The results of significant diagnostics from this hospitalization (including imaging, microbiology, ancillary and laboratory) are listed below for reference.    Significant Diagnostic Studies: Dg Chest Portable 1 View  12/31/2012   *RADIOLOGY REPORT*  Clinical Data: Chest pain and shortness of breath.  PORTABLE CHEST - 1 VIEW  Comparison: None.  Findings: Diffuse interstitial prominence likely reflects chronic lung disease.  No evidence of edema, pulmonary consolidation or pleural effusion.  Heart size and mediastinal contours are within normal limits.  IMPRESSION: No acute findings.  Diffuse interstitial prominence likely reflects component of chronic lung disease.   Original Report Authenticated By: Irish Lack, M.D.    Microbiology: No results found for this or any previous visit (from the past 240 hour(s)).   Labs: Basic Metabolic Panel:  Recent Labs Lab 12/31/12 0940 01/01/13 0438  NA 134* 138  K 3.5 4.1  CL 96 104  CO2 23 25  GLUCOSE 132* 89  BUN 9 9  CREATININE 0.65 0.64  CALCIUM 9.3 8.8   Liver Function Tests:  Recent Labs Lab 01/01/13 0438  AST 17  ALT 15  ALKPHOS 108  BILITOT 0.3  PROT 6.4  ALBUMIN 3.2*   No results found for this basename: LIPASE, AMYLASE,  in the last 168 hours No results found for this basename:  AMMONIA,  in the last 168 hours CBC:  Recent Labs Lab 12/31/12 0940 01/01/13 0438  WBC 10.1 8.6  NEUTROABS 6.3  --   HGB 15.3 14.2  HCT 43.2 41.6  MCV 95.2 96.3  PLT 266 249   Cardiac Enzymes:  Recent Labs Lab 12/31/12 0940 12/31/12 1133 12/31/12 1654 12/31/12 2350 01/01/13 0438  TROPONINI <0.30 <0.30 <0.30 <0.30 <0.30   BNP: BNP (last 3 results) No results found for this basename: PROBNP,  in the last 8760 hours CBG: No results found for this basename: GLUCAP,  in the last 168 hours     Signed:  Kailani Brass  [facility] 01/01/2013, 4:51 PM

## 2013-01-01 NOTE — Progress Notes (Addendum)
AVS reviewed with patient.  Prescriptions provided to patient at discharge.  Verbalized understanding of d/c instructions.  Chaplain met with pt for spiritual consults prior to discharge.  Pt's IV removed. Site WNL.  Pt provided with educational material on smoking cessation.  Pt states all belongings intact and in possession.  Pt refused w/c. Escorted by NT via w/c to main entrance for discharge. Pt stable at time of discharge.

## 2013-01-01 NOTE — Progress Notes (Signed)
UR Chart Review Completed  

## 2013-01-01 NOTE — Progress Notes (Addendum)
Pt saline locked and Nitro removed from chest.  Pt ambulated in hallway on room air and around unit approximately 150 feet.  Tolerated well.  No complaints.

## 2013-01-02 NOTE — Progress Notes (Signed)
Initial visit offering emotional and spiritual support.  Patient was very tearful when I entered his room.  Listened as he shared about his wife's recent death and the family dynamics which he stated are struggles which continue.  Discussed faith and gave information about normalizing his ideas and emotions around his grief. Prayed with him also.

## 2013-01-22 ENCOUNTER — Encounter: Payer: 59 | Admitting: Cardiovascular Disease

## 2017-10-21 DIAGNOSIS — H6693 Otitis media, unspecified, bilateral: Secondary | ICD-10-CM | POA: Diagnosis not present

## 2017-10-21 DIAGNOSIS — Z6824 Body mass index (BMI) 24.0-24.9, adult: Secondary | ICD-10-CM | POA: Diagnosis not present

## 2017-10-21 DIAGNOSIS — Z0001 Encounter for general adult medical examination with abnormal findings: Secondary | ICD-10-CM | POA: Diagnosis not present

## 2017-10-21 DIAGNOSIS — E748 Other specified disorders of carbohydrate metabolism: Secondary | ICD-10-CM | POA: Diagnosis not present

## 2017-10-21 DIAGNOSIS — G4700 Insomnia, unspecified: Secondary | ICD-10-CM | POA: Diagnosis not present

## 2017-10-21 DIAGNOSIS — J329 Chronic sinusitis, unspecified: Secondary | ICD-10-CM | POA: Diagnosis not present

## 2017-10-21 DIAGNOSIS — R7309 Other abnormal glucose: Secondary | ICD-10-CM | POA: Diagnosis not present

## 2017-10-21 DIAGNOSIS — R5383 Other fatigue: Secondary | ICD-10-CM | POA: Diagnosis not present

## 2017-10-21 DIAGNOSIS — Z1389 Encounter for screening for other disorder: Secondary | ICD-10-CM | POA: Diagnosis not present

## 2017-11-04 ENCOUNTER — Ambulatory Visit (INDEPENDENT_AMBULATORY_CARE_PROVIDER_SITE_OTHER): Payer: PPO | Admitting: Urology

## 2017-11-04 DIAGNOSIS — N5201 Erectile dysfunction due to arterial insufficiency: Secondary | ICD-10-CM | POA: Diagnosis not present

## 2017-11-04 DIAGNOSIS — R972 Elevated prostate specific antigen [PSA]: Secondary | ICD-10-CM

## 2017-11-17 ENCOUNTER — Ambulatory Visit (INDEPENDENT_AMBULATORY_CARE_PROVIDER_SITE_OTHER): Payer: PPO | Admitting: Otolaryngology

## 2017-11-17 DIAGNOSIS — H66011 Acute suppurative otitis media with spontaneous rupture of ear drum, right ear: Secondary | ICD-10-CM

## 2017-11-17 DIAGNOSIS — H906 Mixed conductive and sensorineural hearing loss, bilateral: Secondary | ICD-10-CM | POA: Diagnosis not present

## 2017-12-01 ENCOUNTER — Encounter (HOSPITAL_COMMUNITY): Payer: Self-pay | Admitting: Emergency Medicine

## 2017-12-01 ENCOUNTER — Other Ambulatory Visit: Payer: Self-pay

## 2017-12-01 ENCOUNTER — Emergency Department (HOSPITAL_COMMUNITY)
Admission: EM | Admit: 2017-12-01 | Discharge: 2017-12-01 | Disposition: A | Discharge status: Home / Self Care | Payer: PPO | Attending: Emergency Medicine | Admitting: Emergency Medicine

## 2017-12-01 ENCOUNTER — Emergency Department (HOSPITAL_COMMUNITY): Payer: PPO

## 2017-12-01 DIAGNOSIS — Z6824 Body mass index (BMI) 24.0-24.9, adult: Secondary | ICD-10-CM | POA: Diagnosis not present

## 2017-12-01 DIAGNOSIS — N503 Cyst of epididymis: Secondary | ICD-10-CM | POA: Diagnosis not present

## 2017-12-01 DIAGNOSIS — N5089 Other specified disorders of the male genital organs: Secondary | ICD-10-CM

## 2017-12-01 DIAGNOSIS — R972 Elevated prostate specific antigen [PSA]: Secondary | ICD-10-CM | POA: Diagnosis not present

## 2017-12-01 DIAGNOSIS — R1031 Right lower quadrant pain: Secondary | ICD-10-CM | POA: Diagnosis not present

## 2017-12-01 DIAGNOSIS — Z79899 Other long term (current) drug therapy: Secondary | ICD-10-CM | POA: Diagnosis not present

## 2017-12-01 DIAGNOSIS — L729 Follicular cyst of the skin and subcutaneous tissue, unspecified: Secondary | ICD-10-CM | POA: Diagnosis not present

## 2017-12-01 DIAGNOSIS — F17200 Nicotine dependence, unspecified, uncomplicated: Secondary | ICD-10-CM | POA: Insufficient documentation

## 2017-12-01 DIAGNOSIS — R1033 Periumbilical pain: Secondary | ICD-10-CM | POA: Diagnosis not present

## 2017-12-01 DIAGNOSIS — R1032 Left lower quadrant pain: Secondary | ICD-10-CM | POA: Diagnosis not present

## 2017-12-01 LAB — COMPREHENSIVE METABOLIC PANEL
ALT: 19 U/L (ref 17–63)
AST: 20 U/L (ref 15–41)
Albumin: 4.1 g/dL (ref 3.5–5.0)
Alkaline Phosphatase: 124 U/L (ref 38–126)
Anion gap: 10 (ref 5–15)
BUN: 9 mg/dL (ref 6–20)
CHLORIDE: 106 mmol/L (ref 101–111)
CO2: 23 mmol/L (ref 22–32)
CREATININE: 0.88 mg/dL (ref 0.61–1.24)
Calcium: 9.1 mg/dL (ref 8.9–10.3)
GFR calc Af Amer: >60 mL/min (ref >60)
GLUCOSE: 91 mg/dL (ref 65–99)
Potassium: 3.9 mmol/L (ref 3.5–5.1)
SODIUM: 139 mmol/L (ref 135–145)
Total Bilirubin: 0.7 mg/dL (ref 0.3–1.2)
Total Protein: 7.3 g/dL (ref 6.5–8.1)

## 2017-12-01 LAB — CBC
HCT: 41.5 % (ref 39.0–52.0)
Hemoglobin: 13.8 g/dL (ref 13.0–17.0)
MCH: 31 pg (ref 26.0–34.0)
MCHC: 33.3 g/dL (ref 30.0–36.0)
MCV: 93.3 fL (ref 78.0–100.0)
PLATELETS: 262 10*3/uL (ref 150–400)
RBC: 4.45 MIL/uL (ref 4.22–5.81)
RDW: 13.3 % (ref 11.5–15.5)
WBC: 11.4 10*3/uL — ABNORMAL HIGH (ref 4.0–10.5)

## 2017-12-01 LAB — URINALYSIS, ROUTINE W REFLEX MICROSCOPIC
Bacteria, UA: NONE SEEN
Bilirubin Urine: NEGATIVE
GLUCOSE, UA: NEGATIVE mg/dL
Hgb urine dipstick: NEGATIVE
Ketones, ur: NEGATIVE mg/dL
Leukocytes, UA: NEGATIVE
Nitrite: NEGATIVE
PH: 6 (ref 5.0–8.0)
PROTEIN: NEGATIVE mg/dL
Specific Gravity, Urine: 1.006 (ref 1.005–1.030)

## 2017-12-01 LAB — LIPASE, BLOOD: LIPASE: 41 U/L (ref 11–51)

## 2017-12-01 MED ORDER — HYDROCODONE-ACETAMINOPHEN 5-325 MG PO TABS: 2.0000 | ORAL_TABLET | ORAL | 0 refills | Status: AC | PRN

## 2017-12-01 MED ORDER — HYDROMORPHONE HCL 2 MG/ML IJ SOLN
0.5000 mg | Freq: Once | INTRAMUSCULAR | Status: AC
  Administered 2017-12-01: 0.5 mg via INTRAVENOUS
  Filled 2017-12-01: qty 1

## 2017-12-01 NOTE — ED Triage Notes (Signed)
Pt reports abdominal pain x 3 months and testicle swelling. Pt was seen by PCP who reported he had an enlarged prostate. Pt reports the pain  Has worsened in the last 2 days. Pt denies N/V/D. Pt reports no issues with elimination.

## 2017-12-01 NOTE — ED Notes (Signed)
Patient transported to Ultrasound 

## 2017-12-01 NOTE — ED Provider Notes (Signed)
[facility] EMERGENCY DEPARTMENT Provider Note   CSN: [id number] Arrival date & time: 12/01/17  1820     History   Chief Complaint Chief Complaint  Patient presents with  . Abdominal Pain  . Groin Swelling    HPI David White is a 67 y.o. male.  HPI  Patient is a 67 year old male who comes in today complaining of lower abdominal pain and scrotal swelling which has been persisting for approximately 3 months.  Patient is seen a urologist but has not had any imaging studies for further evaluation.  Urology believe it to be a hydrocele causing his scrotal swelling.  Patient is establishing his follow-up with them.  Patient states that his pain is worsened over the past 2 days.  Patient denies any fevers chills vomiting nausea dysuria or penile discharge.  Past Medical History:  Diagnosis Date  . Tobacco abuse     Patient Active Problem List   Diagnosis Date Noted  . Chest pain 12/31/2012  . Grief reaction 12/31/2012  . Tobacco abuse 12/31/2012  . Hyperglycemia 12/31/2012    Past Surgical History:  Procedure Laterality Date  . APPENDECTOMY          Home Medications    Prior to Admission medications   Medication Sig Start Date End Date Taking? Authorizing Provider  ALPRAZolam Duanne Moron) 0.5 MG tablet Take 1 tablet (0.5 mg total) by mouth 3 (three) times daily as needed for anxiety or sleep. 01/01/13  Yes Rexene Alberts, MD  atorvastatin (LIPITOR) 20 MG tablet Take 20 mg by mouth daily. 10/24/17  Yes [provider]  famotidine (PEPCID) 10 MG tablet Take 2 tablets (20 mg total) by mouth daily. Patient not taking: Reported on 12/01/2017 01/01/13   Rexene Alberts, MD  HYDROcodone-acetaminophen (NORCO/VICODIN) 5-325 MG tablet Take 2 tablets by mouth every 4 (four) hours as needed for up to 5 days. 12/01/17 12/06/17  Chapman Moss, MD  oxyCODONE-acetaminophen (ROXICET) 5-325 MG per tablet Take 1 tablet by mouth every 4 (four) hours as needed for  pain. Patient not taking: Reported on 12/01/2017 01/01/13   Rexene Alberts, MD    Family History History reviewed. No pertinent family history.  Social History Social History   Tobacco Use  . Smoking status: Current Every Day Smoker  Substance Use Topics  . Alcohol use: Yes    Comment: few beers  . Drug use: Not on file     Allergies   Codeine   Review of Systems Review of Systems  Constitutional: Negative for chills and fever.  Respiratory: Negative for shortness of breath.   Gastrointestinal: Positive for abdominal pain. Negative for diarrhea, nausea and vomiting.  Genitourinary: Positive for scrotal swelling and testicular pain. Negative for difficulty urinating, discharge, flank pain, hematuria, penile pain and penile swelling.  All other systems reviewed and are negative.    Physical Exam Updated Vital Signs BP 118/71   Pulse 68   Temp 98.2 F (36.8 C) (Oral)   Resp 16   Ht 5\' 9"  (1.753 m)   Wt 74.4 kg (164 lb)   SpO2 93%   BMI 24.22 kg/m   Physical Exam  Constitutional: He appears well-developed and well-nourished.  HENT:  Head: Normocephalic and atraumatic.  Eyes: Conjunctivae are normal.  Neck: Neck supple.  Cardiovascular: Normal rate and regular rhythm.  No murmur heard. Pulmonary/Chest: Effort normal and breath sounds normal. No respiratory distress.  Abdominal: Soft. There is tenderness in the suprapubic area. There is no rebound and no  guarding.  Genitourinary: Penis normal. Left testis shows mass and tenderness.  Genitourinary Comments: Exam concerning for left indirect inguinal hernia not entrapped or incarcerated.  Musculoskeletal: He exhibits no edema.  Neurological: He is alert.  Skin: Skin is warm and dry.  Psychiatric: He has a normal mood and affect.  Nursing note and vitals reviewed.    ED Treatments / Results  Labs (all labs ordered are listed, but only abnormal results are displayed) Labs Reviewed  CBC - Abnormal; Notable for  the following components:      Result Value   WBC 11.4 (*)    All other components within normal limits  LIPASE, BLOOD  COMPREHENSIVE METABOLIC PANEL  URINALYSIS, ROUTINE W REFLEX MICROSCOPIC    EKG None  Radiology US Scrotum W/doppler  Result Date: 12/01/2017 CLINICAL DATA:  67 year old male with a three-month history of scrotal swelling EXAM: SCROTAL ULTRASOUND DOPPLER ULTRASOUND OF THE TESTICLES TECHNIQUE: Complete ultrasound examination of the testicles, epididymis, and other scrotal structures was performed. Color and spectral Doppler ultrasound were also utilized to evaluate blood flow to the testicles. COMPARISON:  None. FINDINGS: Right testicle Measurements: 4.6 x 1.7 x 3.2 cm. No mass or microlithiasis visualized. Left testicle Measurements: 3.9 x 1.9 x 2.6 cm. No mass or microlithiasis visualized. Right epididymis: Normal in size. Simple cyst in the epididymal head measures 1.1 x 0.7 x 1.2 cm consistent with an epididymal cyst. Left epididymis: The solid portion of the epididymal head is normal in size. There is a large mildly septated but otherwise simple cyst in the superior aspect of the scrotum which appears to be affiliated with the epididymal head. Hydrocele:  None visualized. Varicocele:  None visualized. Pulsed Doppler interrogation of both testes demonstrates normal low resistance arterial and venous waveforms bilaterally. IMPRESSION: 1. Large septated but otherwise simple cystic collection in the superior aspect of the left scrotum may represent a mildly complex and loculated hydrocele versus a large epididymal head cyst. A large epididymal cyst is favored. 2. Small right simple epididymal head cyst. 3. No evidence of testicular torsion or mass. Electronically Signed   By: Jacqulynn Cadet M.D.   On: 12/01/2017 20:51    Procedures Procedures (including critical care time)  Medications Ordered in ED Medications  HYDROmorphone (DILAUDID) injection 0.5 mg (0.5 mg Intravenous  Given 12/01/17 2153)     Initial Impression / Assessment and Plan / ED Course  I have reviewed the triage vital signs and the nursing notes.  Pertinent labs & imaging results that were available during my care of the patient were reviewed by me and considered in my medical decision making (see chart for details).     Patient has significant tenderness to the left testicle with a posterior fluid-filled mass.  Digital exam of left inguinal canal concerning for possible indirect hernia.  Laboratory work-up largely within normal limits without concerning findings.  Ultrasound reveals cystic mass with septation noted likely arising from the epididymis.  Will discharge patient with instructions to follow-up with urology for further evaluation.  We will also give prescription for appropriate pain medication. Pt given appropriate f/u and return precautions. Pt voiced understanding and is agreeable to discharge at this time.    Final Clinical Impressions(s) / ED Diagnoses   Final diagnoses:  Scrotal cyst    ED Discharge Orders        Ordered    HYDROcodone-acetaminophen (NORCO/VICODIN) 5-325 MG tablet  Every 4 hours PRN     12/01/17 2235  Chapman Moss, MD 12/02/17 Nolberto Hanlon    Carmin Muskrat, MD 12/03/17 [phone]

## 2017-12-01 NOTE — ED Provider Notes (Signed)
Patient placed in Quick Look pathway, seen and evaluated   Chief Complaint: abdominal pain  HPI:   Pt reports increased pain for 2 days.  Pt reports he has been having abdominal pain and swelling in scrotum for 3 months.   ROS: no uti symptoms no vomitting or diarrhea  Physical Exam:   Gen: No distress  Neuro: Awake and Alert  Skin: Warm    Focused Exam: abdomen diffusely tender    Initiation of care has begun. The patient has been counseled on the process, plan, and necessity for staying for the completion/evaluation, and the remainder of the medical screening examination   Sidney Ace 12/01/17 1841    Carmin Muskrat, MD 12/03/17 [phone]

## 2017-12-01 NOTE — ED Notes (Signed)
ED Provider at bedside. Olen Pel

## 2017-12-02 ENCOUNTER — Ambulatory Visit: Payer: PPO | Admitting: Urology

## 2017-12-02 DIAGNOSIS — N4342 Spermatocele of epididymis, multiple: Secondary | ICD-10-CM

## 2017-12-02 DIAGNOSIS — R972 Elevated prostate specific antigen [PSA]: Secondary | ICD-10-CM | POA: Diagnosis not present

## 2017-12-02 DIAGNOSIS — R109 Unspecified abdominal pain: Secondary | ICD-10-CM

## 2017-12-16 ENCOUNTER — Ambulatory Visit: Payer: PPO | Admitting: Urology

## 2017-12-16 DIAGNOSIS — R972 Elevated prostate specific antigen [PSA]: Secondary | ICD-10-CM | POA: Diagnosis not present

## 2017-12-19 ENCOUNTER — Other Ambulatory Visit: Payer: Self-pay | Admitting: Urology

## 2017-12-22 ENCOUNTER — Other Ambulatory Visit: Payer: Self-pay | Admitting: Urology

## 2017-12-22 DIAGNOSIS — C61 Malignant neoplasm of prostate: Secondary | ICD-10-CM

## 2018-01-02 NOTE — Patient Instructions (Signed)
David White  01/02/2018     @PREFPERIOPPHARMACY @   Your procedure is scheduled on  01/09/2018 .  Report to [facility] at  700   A.M.  Call this number if you have problems the morning of surgery:  [phone]   Remember:  Do not eat or drink after midnight.  You may drink clear liquids until  12 midnight 02/07/2018 .  Clear liquids allowed are:                    Water, Juice (non-citric and without pulp), Carbonated beverages, Clear Tea, Black Coffee only, Plain Jell-O only, Gatorade and Plain Popsicles only    Take these medicines the morning of surgery with A SIP OF WATER  Xanax.    Do not wear jewelry, make-up or nail polish.  Do not wear lotions, powders, or perfumes, or deodorant.  Do not shave 48 hours prior to surgery.  Men may shave face and neck.  Do not bring valuables to the hospital.  [name] is not responsible for any belongings or valuables.  Contacts, dentures or bridgework may not be worn into surgery.  Leave your suitcase in the car.  After surgery it may be brought to your room.  For patients admitted to the hospital, discharge time will be determined by your treatment team.  Patients discharged the day of surgery will not be allowed to drive home.   Name and phone number of your driver:   family Special instructions:  None  Please read over the following fact sheets that you were given. Anesthesia Post-op Instructions and Care and Recovery After Surgery       Transrectal Ultrasound-Guided Prostate Gold Seed Placement, Care After This sheet gives you information about how to care for yourself after your procedure. Your health care provider may also give you more specific instructions. If you have problems or questions, contact your health care provider. What can I expect after the procedure? After the procedure, it is common to have:  Light bleeding from the rectum.  Bruising or tenderness in the area behind the scrotum  (perineum).  Small amounts of blood in your urine. This should only last for a couple of days.  Light brown or red semen. This may last for a couple of weeks.  Follow these instructions at home: Medicines  Take over-the-counter and prescription medicines only as told by your health care provider.  If you were prescribed an antibiotic medicine, take it as told by your health care provider. Do not stop taking the antibiotic even if you start to feel better. Activity  Do not drive for 24 hours if you were given a medicine to help you relax (sedative) during your procedure.  Do not drive or use heavy machinery while taking prescription pain medicine.  Return to your normal activities as told by your health care provider. Ask your health care provider what activities are safe for you.  Ask your health care provider when it is safe for you to resume sexual activity. General instructions  Do not take baths, swim, or use a hot tub until your health care provider approves.  Drink enough water to keep your urine pale yellow.  Keep all follow-up visits as told by your health care provider. This is important. Managing pain, stiffness, and swelling  If directed, put ice on the affected area: ? Put ice in a plastic bag. ? Place  a towel between your skin and the bag. ? Leave the ice on for 20 minutes, 2-3 times a day.  Try not to sit directly on the area behind the scrotum. A soft cushion can help with discomfort. Contact a health care provider if:  You have a fever or chills.  You have more blood in your urine instead of less.  You have blood in your urine for more than 2-3 days after the procedure.  You have trouble urinating or having a bowel movement.  You have pain or burning when urinating.  You have nausea or you vomit. Get help right away if:  You have severe pain that does not get better with medicine.  Your urine is bright red.  You cannot urinate.  You have rectal  bleeding that gets worse.  You have shortness of breath. Summary  After the procedure, you may have blood in your urine and light bleeding from the rectum.  Return to your normal activities as told by your health care provider. Ask your health care provider what activities are safe for you.  Take over-the-counter and prescription medicines only as told by your health care provider.  Contact your health care provider right away if you have increased bleeding during urination or increased rectal bleeding. This information is not intended to replace advice given to you by your health care provider. Make sure you discuss any questions you have with your health care provider. Document Released: 10/12/2016 Document Revised: 10/12/2016 Document Reviewed: 10/12/2016 Elsevier Interactive Patient Education  2018 [employer].  Transrectal Ultrasound-Guided Biopsy A transrectal ultrasound-guided biopsy is a procedure to take samples of tissue from your prostate. Ultrasound images are used to guide the procedure. It is usually done to check the prostate gland for cancer. What happens before the procedure?  Do not eat or drink after midnight on the night before your procedure.  Take medicines as your doctor tells you.  Your doctor may have you stop taking some medicines 5-7 days before the procedure.  You will be given an enema before your procedure. During an enema, a liquid is put into your butt (rectum) to clear out waste.  You may have lab tests the day of your procedure.  Make plans to have someone drive you home. What happens during the procedure?  You will be given medicine to help you relax before the procedure. An IV tube will be put into one of your veins. It will be used to give fluids and medicine.  You will be given medicine to reduce the risk of infection (antibiotic).  You will be placed on your side.  A probe with gel will be put in your butt. This is used to take pictures  of your prostate and the area around it.  A medicine to numb the area is put into your prostate.  A biopsy needle is then inserted and guided to your prostate.  Samples of prostate tissue are taken. The needle is removed.  The samples are sent to a lab to be checked. Results are usually back in 2-3 days. What happens after the procedure?  You will be taken to a room where you will be watched until you are doing okay.  You may have some pain in the area around your butt. You will be given medicines for this.  You may be able to go home the same day. Sometimes, an overnight stay in the hospital is needed. This information is not intended to replace advice given  to you by your health care provider. Make sure you discuss any questions you have with your health care provider. Document Released: 06/16/2009 Document Revised: 12/04/2015 Document Reviewed: 02/14/2013 Elsevier Interactive Patient Education  2018 [employer].  Transrectal Ultrasound Transrectal ultrasound is a procedure that uses sound waves to create images of your prostate gland and nearby tissues. The sound waves are sent through the wall of your rectum into your prostate gland, which is located in front of your rectum. The images show the size and shape of your prostate gland and nearby structures. You may have this test if you have:  Trouble urinating.  Infertility.  An abnormal prostate screening exam.  Tell a health care provider about:  Any allergies you have.  All medicines you are taking, including vitamins, herbs, eye drops, creams, and over-the-counter medicines.  Any blood disorders you have.  Any medical conditions you have.  Any surgeries you have had. What are the risks? Generally, this is a safe procedure. However, problems may occur, including:  Discomfort during the procedure. This is rare.  Blood in your urine or sperm after the procedure. This is rare.  What happens before the  procedure?  Your health care provider may instruct you to use an enema 1-4 hours before the procedure. Follow instructions from your health care provider about how to do the enema.  Ask your health care provider about changing or stopping your regular medicines. This is especially important if you are taking diabetes medicines or blood thinners. What happens during the procedure?  You will be asked to lie down on your left side on an examination table.  You will bend your knees toward your chest.  A lubricated probe will be gently inserted into your rectum. This may cause a feeling of fullness.  The probe will send signals to a computer that will create images.  The technician will slightly rotate the probe throughout the procedure. While rotating the probe, he or she will view and capture images of the prostate gland and the surrounding structures from different angles.  The probe will be removed. The procedure may vary among health care providers and hospitals. What happens after the procedure?  It is up to you to get the results of your procedure. Ask your health care provider, or the department that is doing the procedure, when your results will be ready. Summary  Transrectal ultrasound is a procedure that uses sound waves to create images of your prostate gland and nearby tissues.  The images show the size and shape of your prostate gland and nearby structures.  Before the procedure, ask your health care provider about changing or stopping your regular medicines. This is especially important if you are taking diabetes medicines or blood thinners. This information is not intended to replace advice given to you by your health care provider. Make sure you discuss any questions you have with your health care provider. Document Released: 04/07/2005 Document Revised: 05/21/2016 Document Reviewed: 05/21/2016 Elsevier Interactive Patient Education  2017 [place] Anesthesia is a term that refers to techniques, procedures, and medicines that help a person stay safe and comfortable during a medical procedure. Monitored anesthesia care, or sedation, is one type of anesthesia. Your anesthesia specialist may recommend sedation if you will be having a procedure that does not require you to be unconscious, such as:  Cataract surgery.  A dental procedure.  A biopsy.  A colonoscopy.  During the procedure, you may receive a  medicine to help you relax (sedative). There are three levels of sedation:  Mild sedation. At this level, you may feel awake and relaxed. You will be able to follow directions.  Moderate sedation. At this level, you will be sleepy. You may not remember the procedure.  Deep sedation. At this level, you will be asleep. You will not remember the procedure.  The more medicine you are given, the deeper your level of sedation will be. Depending on how you respond to the procedure, the anesthesia specialist may change your level of sedation or the type of anesthesia to fit your needs. An anesthesia specialist will monitor you closely during the procedure. Let your health care provider know about:  Any allergies you have.  All medicines you are taking, including vitamins, herbs, eye drops, creams, and over-the-counter medicines.  Any use of steroids (by mouth or as a cream).  Any problems you or family members have had with sedatives and anesthetic medicines.  Any blood disorders you have.  Any surgeries you have had.  Any medical conditions you have, such as sleep apnea.  Whether you are pregnant or may be pregnant.  Any use of cigarettes, alcohol, or street drugs. What are the risks? Generally, this is a safe procedure. However, problems may occur, including:  Getting too much medicine (oversedation).  Nausea.  Allergic reaction to medicines.  Trouble breathing. If this happens, a breathing tube may be used  to help with breathing. It will be removed when you are awake and breathing on your own.  Heart trouble.  Lung trouble.  Before the procedure Staying hydrated Follow instructions from your health care provider about hydration, which may include:  Up to 2 hours before the procedure - you may continue to drink clear liquids, such as water, clear fruit juice, black coffee, and plain tea.  Eating and drinking restrictions Follow instructions from your health care provider about eating and drinking, which may include:  8 hours before the procedure - stop eating heavy meals or foods such as meat, fried foods, or fatty foods.  6 hours before the procedure - stop eating light meals or foods, such as toast or cereal.  6 hours before the procedure - stop drinking milk or drinks that contain milk.  2 hours before the procedure - stop drinking clear liquids.  Medicines Ask your health care provider about:  Changing or stopping your regular medicines. This is especially important if you are taking diabetes medicines or blood thinners.  Taking medicines such as aspirin and ibuprofen. These medicines can thin your blood. Do not take these medicines before your procedure if your health care provider instructs you not to.  Tests and exams  You will have a physical exam.  You may have blood tests done to show: ? How well your kidneys and liver are working. ? How well your blood can clot.  General instructions  Plan to have someone take you home from the hospital or clinic.  If you will be going home right after the procedure, plan to have someone with you for 24 hours.  What happens during the procedure?  Your blood pressure, heart rate, breathing, level of pain and overall condition will be monitored.  An IV tube will be inserted into one of your veins.  Your anesthesia specialist will give you medicines as needed to keep you comfortable during the procedure. This may mean changing  the level of sedation.  The procedure will be performed. After the procedure  Your blood pressure, heart rate, breathing rate, and blood oxygen level will be monitored until the medicines you were given have worn off.  Do not drive for 24 hours if you received a sedative.  You may: ? Feel sleepy, clumsy, or nauseous. ? Feel forgetful about what happened after the procedure. ? Have a sore throat if you had a breathing tube during the procedure. ? Vomit. This information is not intended to replace advice given to you by your health care provider. Make sure you discuss any questions you have with your health care provider. Document Released: 03/24/2005 Document Revised: 12/05/2015 Document Reviewed: 10/19/2015 Elsevier Interactive Patient Education  2018 [place], Care After These instructions provide you with information about caring for yourself after your procedure. Your health care provider may also give you more specific instructions. Your treatment has been planned according to current medical practices, but problems sometimes occur. Call your health care provider if you have any problems or questions after your procedure. What can I expect after the procedure? After your procedure, it is common to:  Feel sleepy for several hours.  Feel clumsy and have poor balance for several hours.  Feel forgetful about what happened after the procedure.  Have poor judgment for several hours.  Feel nauseous or vomit.  Have a sore throat if you had a breathing tube during the procedure.  Follow these instructions at home: For at least 24 hours after the procedure:   Do not: ? Participate in activities in which you could fall or become injured. ? Drive. ? Use heavy machinery. ? Drink alcohol. ? Take sleeping pills or medicines that cause drowsiness. ? Make important decisions or sign legal documents. ? Take care of children on your own.  Rest. Eating  and drinking  Follow the diet that is recommended by your health care provider.  If you vomit, drink water, juice, or soup when you can drink without vomiting.  Make sure you have little or no nausea before eating solid foods. General instructions  Have a responsible adult stay with you until you are awake and alert.  Take over-the-counter and prescription medicines only as told by your health care provider.  If you smoke, do not smoke without supervision.  Keep all follow-up visits as told by your health care provider. This is important. Contact a health care provider if:  You keep feeling nauseous or you keep vomiting.  You feel light-headed.  You develop a rash.  You have a fever. Get help right away if:  You have trouble breathing. This information is not intended to replace advice given to you by your health care provider. Make sure you discuss any questions you have with your health care provider. Document Released: 10/19/2015 Document Revised: 02/18/2016 Document Reviewed: 10/19/2015 Elsevier Interactive Patient Education  [employer].

## 2018-01-05 ENCOUNTER — Encounter (HOSPITAL_COMMUNITY): Payer: Self-pay

## 2018-01-05 ENCOUNTER — Other Ambulatory Visit: Payer: Self-pay

## 2018-01-05 ENCOUNTER — Encounter (HOSPITAL_COMMUNITY)
Admission: RE | Admit: 2018-01-05 | Discharge: 2018-01-05 | Disposition: A | Discharge status: Home / Self Care | Payer: PPO | Source: Ambulatory Visit | Attending: Urology | Admitting: Urology

## 2018-01-05 DIAGNOSIS — Z01810 Encounter for preprocedural cardiovascular examination: Secondary | ICD-10-CM | POA: Insufficient documentation

## 2018-01-05 HISTORY — DX: Unspecified osteoarthritis, unspecified site: M19.90

## 2018-01-05 HISTORY — DX: Pure hypercholesterolemia, unspecified: E78.00

## 2018-01-05 HISTORY — DX: Anxiety disorder, unspecified: F41.9

## 2018-01-06 MED ORDER — GENTAMICIN SULFATE 40 MG/ML IJ SOLN
5.0000 mg/kg | INTRAMUSCULAR | Status: AC
  Administered 2018-01-09: 370 mg via INTRAVENOUS
  Filled 2018-01-06: qty 9.25

## 2018-01-09 ENCOUNTER — Encounter (HOSPITAL_COMMUNITY): Payer: Self-pay

## 2018-01-09 ENCOUNTER — Ambulatory Visit (HOSPITAL_COMMUNITY): Payer: PPO

## 2018-01-09 ENCOUNTER — Ambulatory Visit (HOSPITAL_COMMUNITY)
Admission: RE | Admit: 2018-01-09 | Discharge: 2018-01-09 | Disposition: A | Discharge status: Home / Self Care | Payer: PPO | Source: Ambulatory Visit | Attending: Urology | Admitting: Urology

## 2018-01-09 ENCOUNTER — Ambulatory Visit (HOSPITAL_COMMUNITY): Payer: PPO | Admitting: Anesthesiology

## 2018-01-09 ENCOUNTER — Encounter (HOSPITAL_COMMUNITY)
Admission: RE | Disposition: A | Discharge status: Home / Self Care | Payer: Self-pay | Source: Ambulatory Visit | Attending: Urology

## 2018-01-09 DIAGNOSIS — E7800 Pure hypercholesterolemia, unspecified: Secondary | ICD-10-CM | POA: Diagnosis not present

## 2018-01-09 DIAGNOSIS — Z8546 Personal history of malignant neoplasm of prostate: Secondary | ICD-10-CM | POA: Diagnosis not present

## 2018-01-09 DIAGNOSIS — F17210 Nicotine dependence, cigarettes, uncomplicated: Secondary | ICD-10-CM | POA: Diagnosis not present

## 2018-01-09 DIAGNOSIS — F419 Anxiety disorder, unspecified: Secondary | ICD-10-CM | POA: Diagnosis not present

## 2018-01-09 DIAGNOSIS — C61 Malignant neoplasm of prostate: Secondary | ICD-10-CM | POA: Diagnosis not present

## 2018-01-09 DIAGNOSIS — F17200 Nicotine dependence, unspecified, uncomplicated: Secondary | ICD-10-CM | POA: Diagnosis not present

## 2018-01-09 DIAGNOSIS — Z885 Allergy status to narcotic agent status: Secondary | ICD-10-CM | POA: Diagnosis not present

## 2018-01-09 DIAGNOSIS — R972 Elevated prostate specific antigen [PSA]: Secondary | ICD-10-CM | POA: Diagnosis not present

## 2018-01-09 DIAGNOSIS — D075 Carcinoma in situ of prostate: Secondary | ICD-10-CM | POA: Diagnosis not present

## 2018-01-09 DIAGNOSIS — M1990 Unspecified osteoarthritis, unspecified site: Secondary | ICD-10-CM | POA: Diagnosis not present

## 2018-01-09 HISTORY — PX: PROSTATE BIOPSY: SHX241

## 2018-01-09 SURGERY — BIOPSY, PROSTATE, RECTAL APPROACH, WITH US GUIDANCE
Anesthesia: Monitor Anesthesia Care | Site: Rectum

## 2018-01-09 MED ORDER — PROPOFOL 10 MG/ML IV BOLUS
INTRAVENOUS | Status: DC | PRN
  Administered 2018-01-09: 20 mg via INTRAVENOUS
  Administered 2018-01-09: 40 mg via INTRAVENOUS
  Administered 2018-01-09: 20 mg via INTRAVENOUS

## 2018-01-09 MED ORDER — GENTAMICIN IN SALINE 1.6-0.9 MG/ML-% IV SOLN
INTRAVENOUS | Status: AC
  Filled 2018-01-09: qty 50

## 2018-01-09 MED ORDER — PROPOFOL 500 MG/50ML IV EMUL
INTRAVENOUS | Status: DC | PRN
  Administered 2018-01-09: 100 ug/kg/min via INTRAVENOUS

## 2018-01-09 MED ORDER — FLEET ENEMA 7-19 GM/118ML RE ENEM
1.0000 | ENEMA | Freq: Once | RECTAL | Status: AC
  Administered 2018-01-09: 1 via RECTAL
  Filled 2018-01-09: qty 1

## 2018-01-09 MED ORDER — LACTATED RINGERS IV SOLN
INTRAVENOUS | Status: DC
  Administered 2018-01-09: 08:00:00 via INTRAVENOUS

## 2018-01-09 MED ORDER — LIDOCAINE HCL URETHRAL/MUCOSAL 2 % EX GEL
CUTANEOUS | Status: DC | PRN
  Administered 2018-01-09: 1

## 2018-01-09 MED ORDER — PROMETHAZINE HCL 25 MG/ML IJ SOLN: 6.2500 mg | INTRAMUSCULAR | Status: DC | PRN

## 2018-01-09 MED ORDER — MEPERIDINE HCL 50 MG/ML IJ SOLN: 6.2500 mg | INTRAMUSCULAR | Status: DC | PRN

## 2018-01-09 MED ORDER — LIDOCAINE HCL URETHRAL/MUCOSAL 2 % EX GEL
CUTANEOUS | Status: AC
  Filled 2018-01-09: qty 10

## 2018-01-09 MED ORDER — LACTATED RINGERS IV SOLN: INTRAVENOUS | Status: DC

## 2018-01-09 MED ORDER — PROPOFOL 10 MG/ML IV BOLUS
INTRAVENOUS | Status: AC
  Filled 2018-01-09: qty 20

## 2018-01-09 SURGICAL SUPPLY — 4 items
COVER MAYO STAND XLG (DRAPE) ×3 IMPLANT
GAUZE SPONGE 4X4 12PLY STRL (GAUZE/BANDAGES/DRESSINGS) ×3 IMPLANT
SURGILUBE 2OZ TUBE FLIPTOP (MISCELLANEOUS) ×3 IMPLANT
TOWEL OR 17X26 4PK STRL BLUE (TOWEL DISPOSABLE) ×3 IMPLANT

## 2018-01-09 NOTE — H&P (Signed)
Urology Admission H&P  Chief Complaint: prostate cancer  History of Present Illness: Mr Tsang is a 67yo with a hx of prostate cancer here for repeat prostate biopsy for surveillance of his prostate cancer.  Past Medical History:  Diagnosis Date  . Anxiety   . Arthritis   . Hypercholesteremia   . Tobacco abuse    Past Surgical History:  Procedure Laterality Date  . APPENDECTOMY    . removal of cyst     back of head    Home Medications:  Current Facility-Administered Medications  Medication Dose Route Frequency Provider Last Rate Last Dose  . gentamicin (GARAMYCIN) 370 mg in dextrose 5 % 100 mL IVPB  5 mg/kg Intravenous 30 min Pre-Op Cleon Gustin, MD      . lactated ringers infusion   Intravenous Continuous Vena Rua, MD 50 mL/hr at 01/09/18 0807     Allergies:  Allergies  Allergen Reactions  . Codeine Other (See Comments)    Headache dizziness    History reviewed. No pertinent family history. Social History:  reports that he has been smoking cigarettes.  He has a 100.00 pack-year smoking history. He has never used smokeless tobacco. He reports that he drinks alcohol. He reports that he does not use drugs.  Review of Systems  All other systems reviewed and are negative.   Physical Exam:  Vital signs in last 24 hours: Temp:  [97.6 F (36.4 C)] 97.6 F (36.4 C) (07/01 0730) Pulse Rate:  [91] 91 (07/01 0730) Resp:  [20] 20 (07/01 0730) BP: (134)/(82) 134/82 (07/01 0730) SpO2:  [95 %] 95 % (07/01 0730) Weight:  [75.3 kg (166 lb)] 75.3 kg (166 lb) (07/01 0730) Physical Exam  Constitutional: He is oriented to person, place, and time. He appears well-developed and well-nourished.  HENT:  Head: Normocephalic and atraumatic.  Eyes: Pupils are equal, round, and reactive to light. EOM are normal.  Neck: Normal range of motion. No thyromegaly present.  Cardiovascular: Normal rate and regular rhythm.  Respiratory: Effort normal. No respiratory distress.   GI: Soft. He exhibits no distension.  Musculoskeletal: Normal range of motion. He exhibits no edema.  Neurological: He is alert and oriented to person, place, and time.  Skin: Skin is warm and dry.  Psychiatric: He has a normal mood and affect. His behavior is normal. Judgment and thought content normal.    Laboratory Data:  No results found for this or any previous visit (from the past 24 hour(s)). No results found for this or any previous visit (from the past 240 hour(s)). Creatinine: No results for input(s): CREATININE in the last 168 hours. Baseline Creatinine: unknown  Impression/Assessment:  66yo with prostate cancer here for repeat prostate biopsy  Plan:  The risks/benefits/alternatives to prostate biopsy was explained to the patient and he understands and wishes to proceed with surgery  Nicolette Bang 01/09/2018, 8:52 AM

## 2018-01-09 NOTE — Anesthesia Postprocedure Evaluation (Signed)
Anesthesia Post Note  Patient: David White  Procedure(s) Performed: BIOPSY TRANSRECTAL ULTRASONIC PROSTATE (TUBP) (N/A Rectum)  Patient location during evaluation: PACU Anesthesia Type: MAC Level of consciousness: awake and alert and patient cooperative Pain management: satisfactory to patient Vital Signs Assessment: post-procedure vital signs reviewed and stable Respiratory status: spontaneous breathing Cardiovascular status: stable Postop Assessment: no apparent nausea or vomiting Anesthetic complications: no     Last Vitals:  Vitals:   01/09/18 0930 01/09/18 0945  BP: 105/74 120/79  Pulse: 76 81  Resp: 14 16  Temp:    SpO2: 95% 95%    Last Pain:  Vitals:   01/09/18 0945  TempSrc:   PainSc: 0-No pain                 Fredi Hurtado

## 2018-01-09 NOTE — Discharge Instructions (Signed)
Transrectal Ultrasound-Guided Biopsy A transrectal ultrasound-guided biopsy is a procedure to take samples of tissue from your prostate. Ultrasound images are used to guide the procedure. It is usually done to check the prostate gland for cancer. What happens before the procedure?  Do not eat or drink after midnight on the night before your procedure.  Take medicines as your doctor tells you.  Your doctor may have you stop taking some medicines 5-7 days before the procedure.  You will be given an enema before your procedure. During an enema, a liquid is put into your butt (rectum) to clear out waste.  You may have lab tests the day of your procedure.  Make plans to have someone drive you home. What happens during the procedure?  You will be given medicine to help you relax before the procedure. An IV tube will be put into one of your veins. It will be used to give fluids and medicine.  You will be given medicine to reduce the risk of infection (antibiotic).  You will be placed on your side.  A probe with gel will be put in your butt. This is used to take pictures of your prostate and the area around it.  A medicine to numb the area is put into your prostate.  A biopsy needle is then inserted and guided to your prostate.  Samples of prostate tissue are taken. The needle is removed.  The samples are sent to a lab to be checked. Results are usually back in 2-3 days. What happens after the procedure?  You will be taken to a room where you will be watched until you are doing okay.  You may have some pain in the area around your butt. You will be given medicines for this.  You may be able to go home the same day. Sometimes, an overnight stay in the hospital is needed. This information is not intended to replace advice given to you by your health care provider. Make sure you discuss any questions you have with your health care provider. Document Released: 06/16/2009 Document Revised:  12/04/2015 Document Reviewed: 02/14/2013 Elsevier Interactive Patient Education  2018 [employer]  

## 2018-01-09 NOTE — Op Note (Signed)
Pre op diagnosis: Elevated PSA, history of prostate cancer  Post op diagnosis: same       Procedure: Transrectal ultrasound of the prostate, Ultrasound Guided Prostate needle biopsy.   Attending: Nicolette Bang  Anesthesia: General  EBL: minimal  Antibiotics: Gentamicin  Drains: none  Findings: 28.6g prostate with prominent right lobe. Prostate measurements: width 4.5cm, height 2.56cm, length 4.8cm no hypoechoic or hyperechoic lesions   Indications: David White is a 67yo male with a history of elevated PSA and history of prostate cancer. After discussing workup he has elected to proceed with prostate biopsy  Procedure in detail: Prior to procedure consent was obtained. The patient was brought to the OR and a brief timeout was done to ensure correct patient, correct procedure, and correct site. General anesthesia was administered and patient was placed in the dorsal lithotomy position. The prostate size was estimated to be 40g by digital rectal exam. The 10 MHz transrectal ultrasound probe was placed into the rectum. Prostate width measured 4.5cm, height 2.56cm, length 4.8cm. Prostate volume was measured to be 28.6 cc. The biopsy cores were obtained using direct, real-time ultrasound guidance utilizing a standard 12core pattern with one core from the right apex lateral using real time ultrasound guidance to direct the biopsy core being taken from this location, right apex medial using real time ultrasound guidance to direct the biopsy core being taken from this location, left apex lateral using real time ultrasound guidance to direct the biopsy core being taken from this location, left apex medial using real time ultrasound guidance to direct the biopsy core being taken from this location, 2 right mid lateral using real time ultrasound guidance to direct the biopsy core being taken from this location, 2 right mid medial using real time ultrasound guidance to direct the biopsy core being taken from  this location, left mid lateral using real time ultrasound guidance to direct the biopsy core being taken from this location, left mid medial using real time ultrasound guidance to direct the biopsy core being taken from this location, right base lateral using real time ultrasound guidance to direct the biopsy core being taken from this location, right base medial using real time ultrasound guidance to direct the biopsy core being taken from this location, left base lateral using real time ultrasound guidance to direct the biopsy core being taken from this location and left base medial of the prostate using real time ultrasound guidance to direct the biopsy core being taken from this location. The biopsy cores were placed in buffered formalin and sent to pathology. This then concluded the procedure which was well tolerated by the patient.   Complications:. None.   Condition: Stable, extubated, transferred to PACU  Plan: Patient is to be discharged home. They are to followup in 1 week for pathology discussion

## 2018-01-09 NOTE — Anesthesia Preprocedure Evaluation (Signed)
Anesthesia Evaluation  Patient identified by MRN, date of birth, ID band Patient awake    Reviewed: Allergy & Precautions, H&P , NPO status , Patient's Chart, lab work & pertinent test results, reviewed documented beta blocker date and time   Airway Mallampati: II  TM Distance: >3 FB Neck ROM: full    Dental no notable dental hx. (+) Edentulous Upper, Edentulous Lower   Pulmonary neg pulmonary ROS, Current Smoker,    Pulmonary exam normal breath sounds clear to auscultation       Cardiovascular Exercise Tolerance: Good negative cardio ROS   Rhythm:regular Rate:Normal     Neuro/Psych Anxiety negative neurological ROS  negative psych ROS   GI/Hepatic negative GI ROS, Neg liver ROS,   Endo/Other  negative endocrine ROS  Renal/GU negative Renal ROS  negative genitourinary   Musculoskeletal   Abdominal   Peds  Hematology negative hematology ROS (+)   Anesthesia Other Findings H/o tobacco abuse. Decreased excursion with scattered crackles  No inhalers  Reproductive/Obstetrics negative OB ROS                             Anesthesia Physical Anesthesia Plan  ASA: III  Anesthesia Plan: MAC   Post-op Pain Management:    Induction:   PONV Risk Score and Plan:   Airway Management Planned:   Additional Equipment:   Intra-op Plan:   Post-operative Plan:   Informed Consent: I have reviewed the patients History and Physical, chart, labs and discussed the procedure including the risks, benefits and alternatives for the proposed anesthesia with the patient or authorized representative who has indicated his/her understanding and acceptance.   Dental Advisory Given  Plan Discussed with: CRNA and Anesthesiologist  Anesthesia Plan Comments:         Anesthesia Quick Evaluation

## 2018-01-09 NOTE — Transfer of Care (Signed)
Immediate Anesthesia Transfer of Care Note  Patient: David White  Procedure(s) Performed: BIOPSY TRANSRECTAL ULTRASONIC PROSTATE (TUBP) (N/A Rectum)  Patient Location: PACU  Anesthesia Type:MAC  Level of Consciousness: awake and patient cooperative  Airway & Oxygen Therapy: Patient Spontanous Breathing and Patient connected to nasal cannula oxygen  Post-op Assessment: Report given to RN and Post -op Vital signs reviewed and stable  Post vital signs: Reviewed and stable  Last Vitals:  Vitals Value Taken Time  BP    Temp    Pulse    Resp    SpO2      Last Pain:  Vitals:   01/09/18 0730  TempSrc: Oral  PainSc: 2          Complications: No apparent anesthesia complications

## 2018-01-09 NOTE — Anesthesia Procedure Notes (Signed)
Procedure Name: MAC Date/Time: 01/09/2018 9:03 AM Performed by: Vista Deck, CRNA Pre-anesthesia Checklist: Patient identified, Emergency Drugs available, Suction available, Timeout performed and Patient being monitored Patient Re-evaluated:Patient Re-evaluated prior to induction Oxygen Delivery Method: Nasal Cannula

## 2018-01-10 ENCOUNTER — Encounter (HOSPITAL_COMMUNITY): Payer: Self-pay | Admitting: Urology

## 2018-01-18 ENCOUNTER — Other Ambulatory Visit: Payer: Self-pay | Admitting: Urology

## 2018-01-18 ENCOUNTER — Ambulatory Visit (INDEPENDENT_AMBULATORY_CARE_PROVIDER_SITE_OTHER): Payer: PPO | Admitting: Urology

## 2018-01-18 DIAGNOSIS — C61 Malignant neoplasm of prostate: Secondary | ICD-10-CM

## 2018-01-20 ENCOUNTER — Other Ambulatory Visit: Payer: Self-pay | Admitting: Urology

## 2018-01-20 ENCOUNTER — Encounter (HOSPITAL_COMMUNITY)
Admission: RE | Admit: 2018-01-20 | Discharge: 2018-01-20 | Disposition: A | Discharge status: Home / Self Care | Payer: PPO | Source: Ambulatory Visit | Attending: Urology | Admitting: Urology

## 2018-01-20 ENCOUNTER — Encounter: Payer: Self-pay | Admitting: Radiation Oncology

## 2018-01-20 DIAGNOSIS — C61 Malignant neoplasm of prostate: Secondary | ICD-10-CM

## 2018-01-20 IMAGING — NM NM BONE WHOLE BODY
4 series · 4 of 4 positions shown · non-contrast
Comparison: [DATE]

CLINICAL DATA: Malignant neoplasm of the prostate gland.

EXAM:
NUCLEAR MEDICINE WHOLE BODY BONE SCAN
TECHNIQUE: Whole body anterior and posterior images were obtained approximately
3 hours after intravenous injection of radiopharmaceutical.
RADIOPHARMACEUTICALS:  18.9 mCi [3F] MDP IV

[Series 1: whole body · 2.66mm/px · 1 of 1 slices shown (1 of 2)]
[im 1/1]
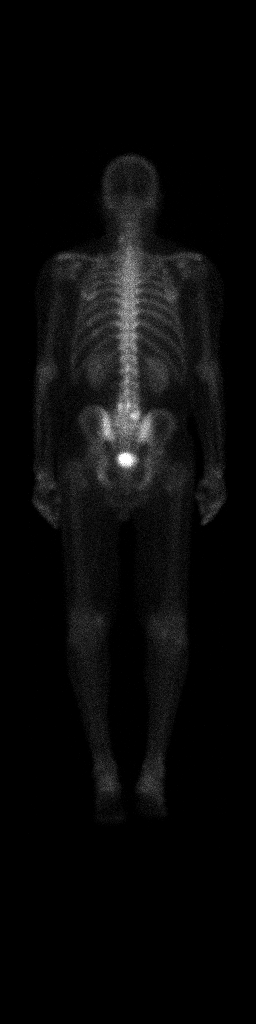

[Series 1: wbr_bone_60 whole body · 2.66mm/px · 1 of 1 slices shown (1 of 2)]
[im 1/1]
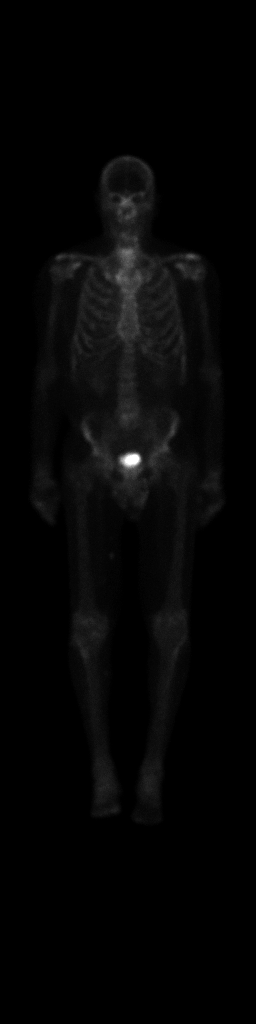

[Series 1: wbr_bone_60 whole body · 2.66mm/px · 1 of 1 slices shown (2 of 2)]
[im 1/1]
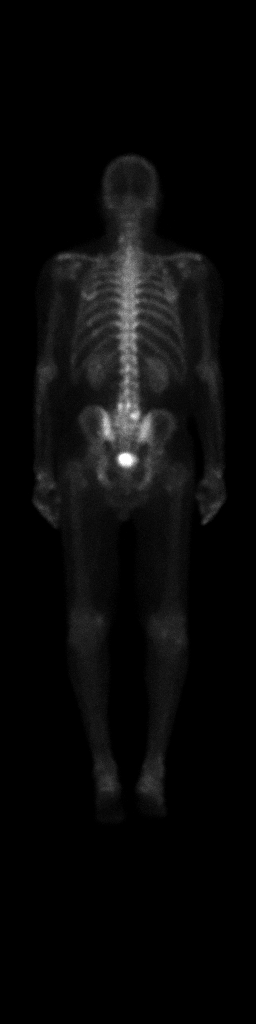

[Series 1: whole body · 2.66mm/px · 1 of 1 slices shown (2 of 2)]
[im 1/1]
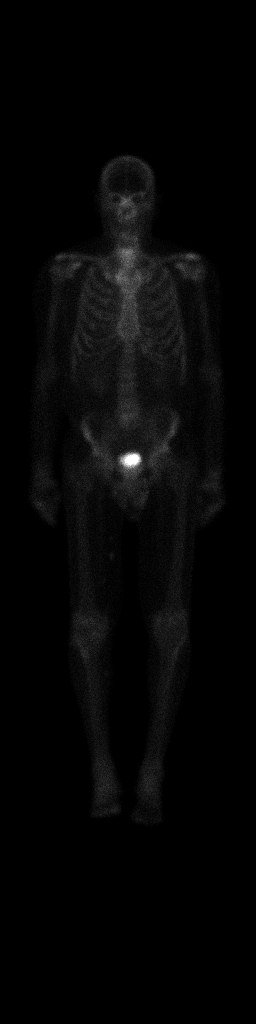

[4 of 4 positions shown; findings below may reference images not displayed]

FINDINGS: There is a small focus of increased uptake within the posterior
aspect of the left iliac bone. There is also a small focus of
increased uptake localizing to the posterior aspect of the left
inferior pubic rami.Small foci of increased uptake localizing to the
posterior aspect of the left ninth and tenth ribs are likely
posttraumatic. Degenerative type changes are identified within the
lumbosacral region. Degenerative type change noted within the
shoulders and knees. There is normal physiologic tracer activity
within both kidneys an urinary bladder.
IMPRESSION: No specific findings identified highly suspicious for bone
metastasis.

Two small foci of increased uptake within the left hemipelvis are
noted posteriorly. Indeterminate. In the setting of new diagnosis of
prostate cancer and elevated PSA consider further evaluation with CT
of the pelvis and/or correlation with plain film radiographs of the
pelvis.

## 2018-01-20 MED ORDER — TECHNETIUM TC 99M MEDRONATE IV KIT
20.0000 | PACK | Freq: Once | INTRAVENOUS | Status: AC | PRN
  Administered 2018-01-20: 18.9 via INTRAVENOUS

## 2018-02-10 ENCOUNTER — Ambulatory Visit (HOSPITAL_COMMUNITY)
Admission: RE | Admit: 2018-02-10 | Discharge: 2018-02-10 | Disposition: A | Discharge status: Home / Self Care | Payer: PPO | Source: Ambulatory Visit | Attending: Urology | Admitting: Urology

## 2018-02-10 DIAGNOSIS — C61 Malignant neoplasm of prostate: Secondary | ICD-10-CM | POA: Diagnosis not present

## 2018-02-10 DIAGNOSIS — I700 Atherosclerosis of aorta: Secondary | ICD-10-CM | POA: Insufficient documentation

## 2018-02-10 LAB — POCT I-STAT CREATININE: CREATININE: 0.9 mg/dL (ref 0.61–1.24)

## 2018-02-10 IMAGING — CT CT ABD-PEL WO/W CM
2 of 9 series · 12 of 46 positions shown, 19 images · IV contrast (iopamidol)
Comparison: Nuclear medicine bone scan [DATE]

CLINICAL DATA: Prostate cancer

EXAM:
CT ABDOMEN AND PELVIS WITHOUT AND WITH CONTRAST
TECHNIQUE: Multidetector CT imaging of the abdomen and pelvis was performed
following the standard protocol before and following the bolus
administration of intravenous contrast.
CONTRAST:  100mL [50] IOPAMIDOL ([50]) INJECTION 61%

[Series 4: coronal st · coronal · 0.90mm/px · 3 of 86 slices shown, 4 images]
[im 22/86  soft-tissue]
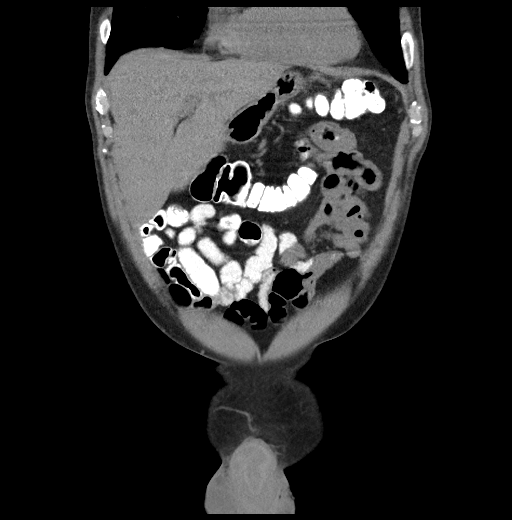
[im 43/86  soft-tissue]
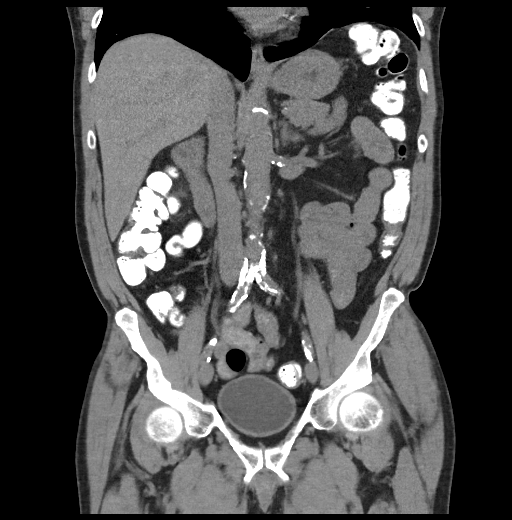
[im 43/86  bone]
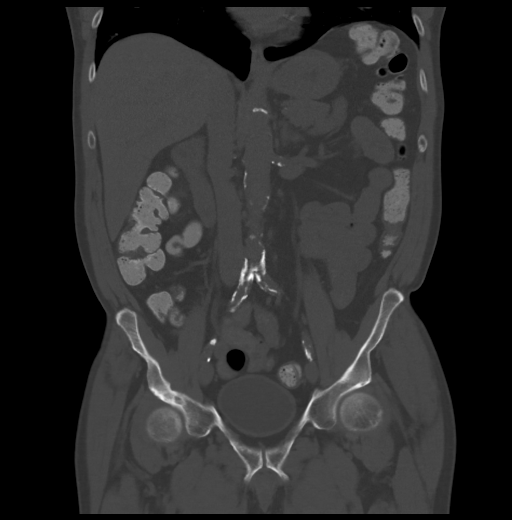
[im 64/86  soft-tissue]
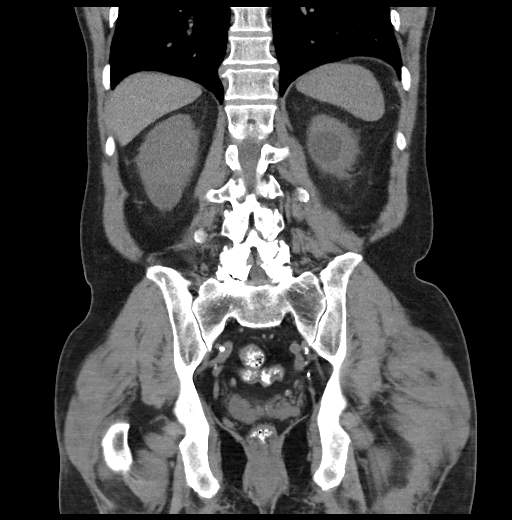

[Series 7: axial st · axial · 0.69mm/px · z∈[+962,+1362]mm · 9 of 101 slices shown, 15 images]
[im 11/101  soft-tissue]
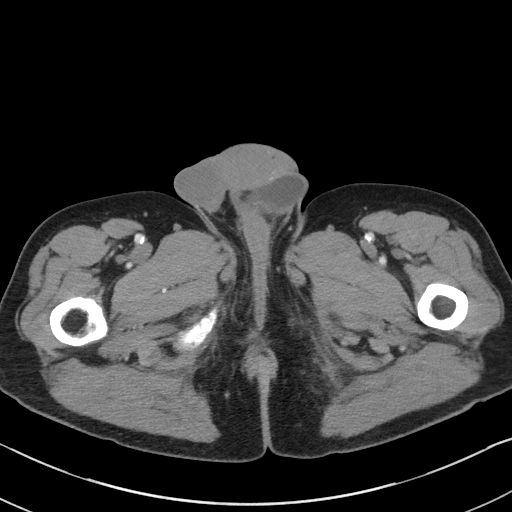
[im 11/101  bone]
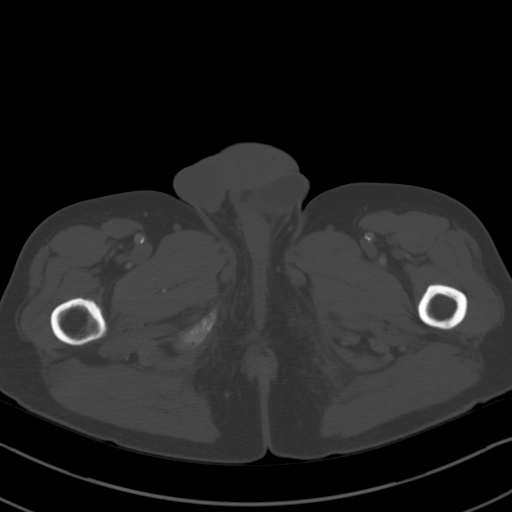
[im 21/101  soft-tissue]
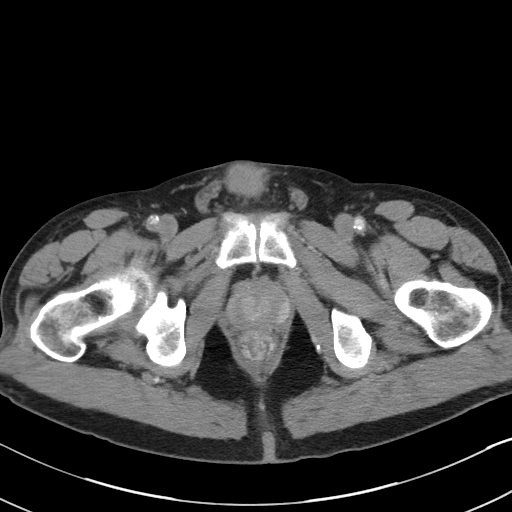
[im 31/101  soft-tissue]
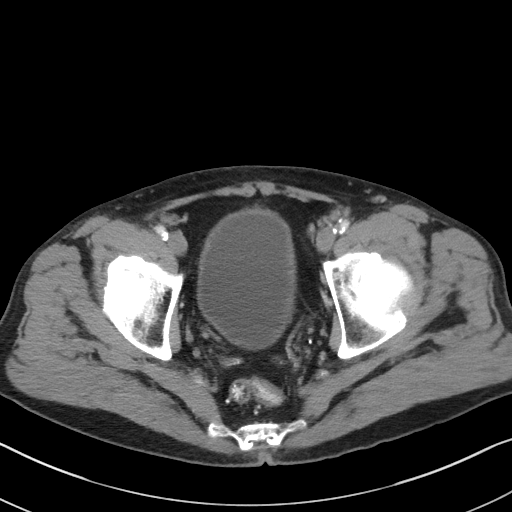
[im 41/101  soft-tissue]
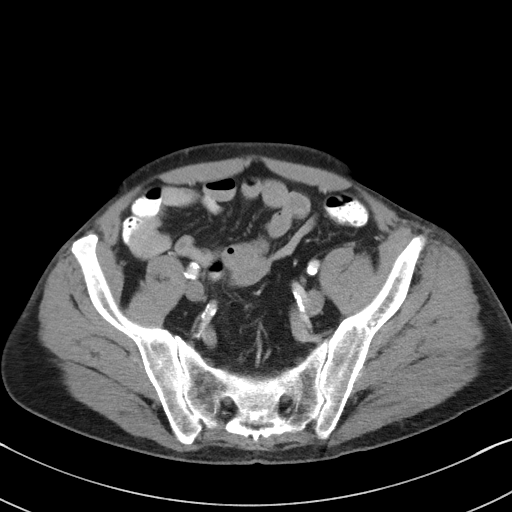
[im 51/101  soft-tissue]
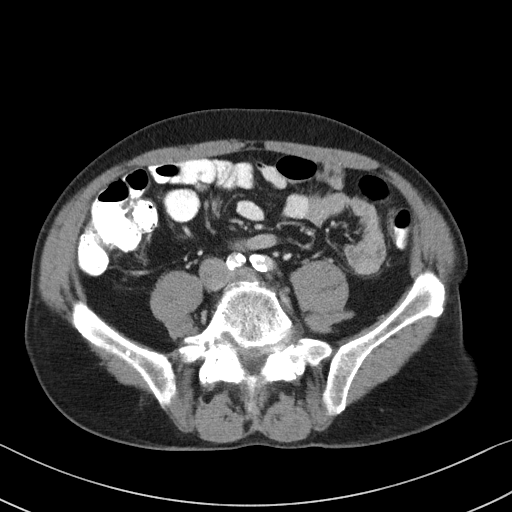
[im 61/101  soft-tissue]
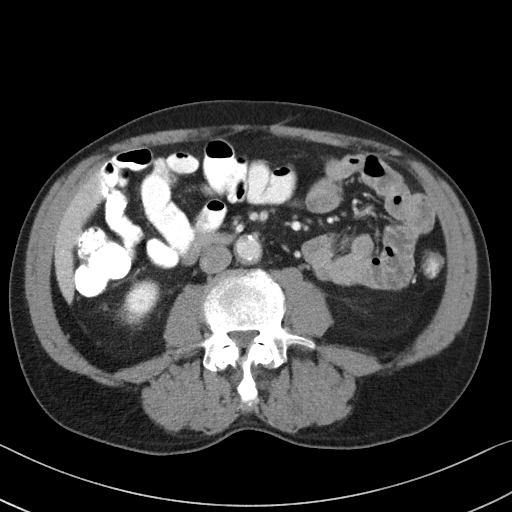
[im 61/101  lung]
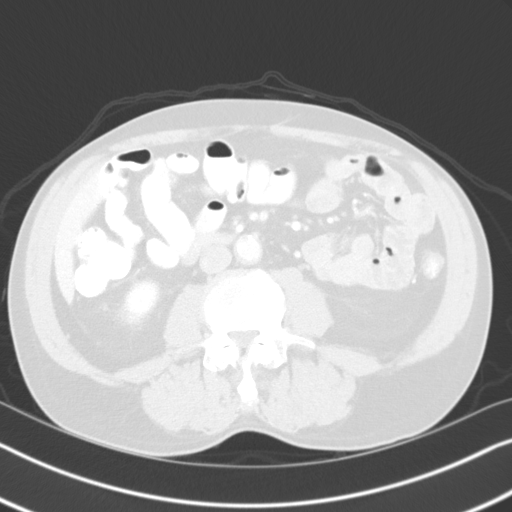
[im 71/101  soft-tissue]
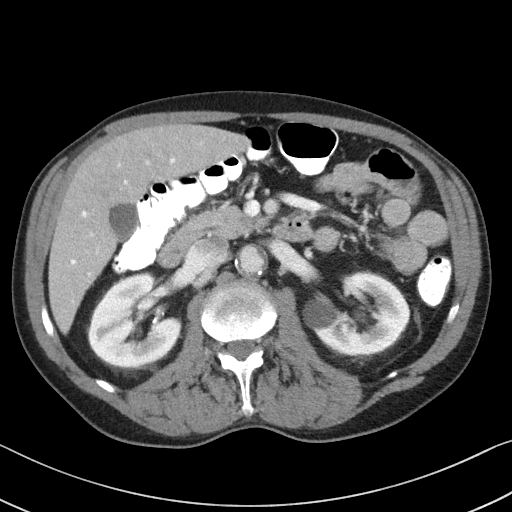
[im 71/101  lung]
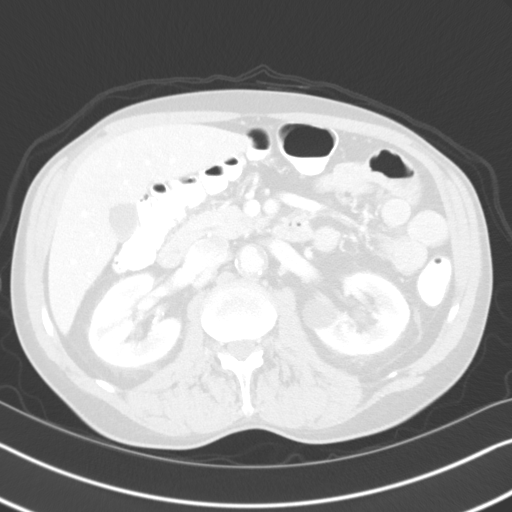
[im 81/101  soft-tissue]
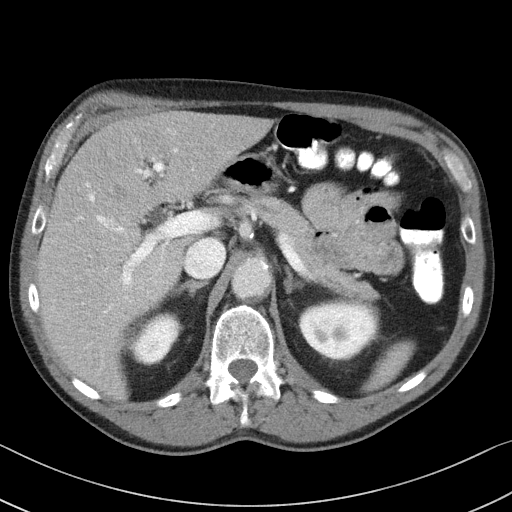
[im 81/101  lung]
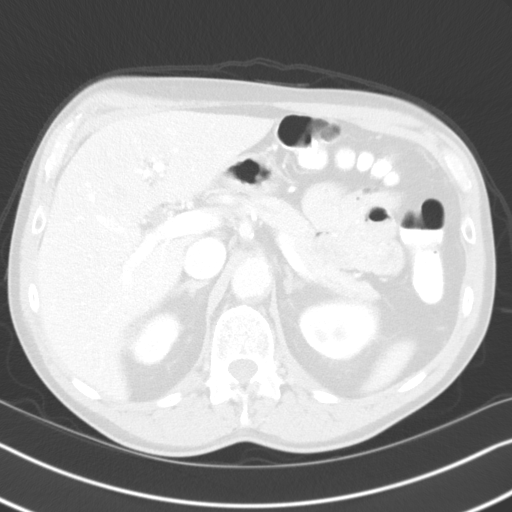
[im 91/101  soft-tissue]
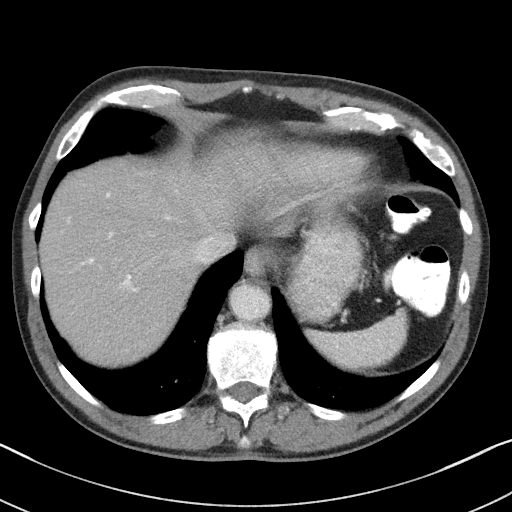
[im 91/101  lung]
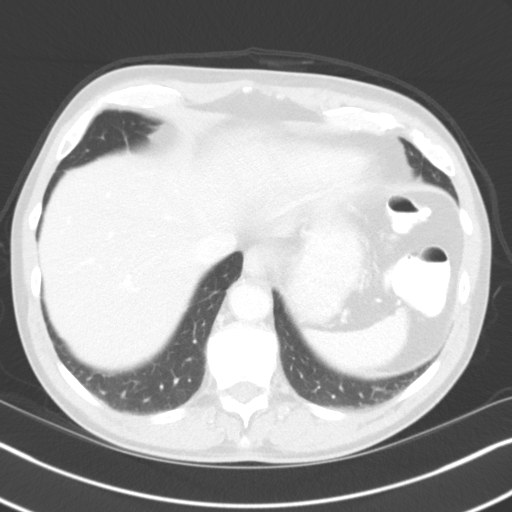
[im 91/101  bone]
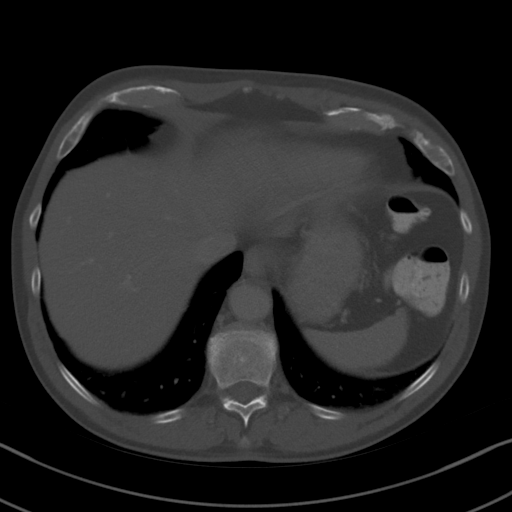

[12 of 46 positions shown; findings below may reference images not displayed]

FINDINGS: Lower chest: Lung bases are clear.

Hepatobiliary: No focal hepatic lesion. No biliary duct dilatation.
Gallbladder is normal. Common bile duct is normal.

Pancreas: Pancreas is normal. No ductal dilatation. No pancreatic
inflammation.

Spleen: Normal spleen

Adrenals/urinary tract: Adrenal glands normal. Bilateral simple
fluid renal cysts. Ureters and bladder normal.

Stomach/Bowel: Stomach, small bowel, appendix, and cecum are normal.
The colon and rectosigmoid colon are normal.

Vascular/Lymphatic: Abdominal aorta is normal caliber with
atherosclerotic calcification. There is no retroperitoneal or
periportal lymphadenopathy. No pelvic lymphadenopathy.

Reproductive: Prostate normal.

Other: No free fluid.

Musculoskeletal: There is no lesion within the LEFT hemipelvis to
correspond to the indeterminate lesions on nuclear medicine bone
scan. Aggressive lesions elsewhere. Degenerate changes in the lower
lumbar spine. Remote posterior LEFT rib fractures
IMPRESSION: 1. No evidence of prostate cancer metastasis within the bones.
2. No lymphadenopathy
3.  Aortic Atherosclerosis ([50]-[50]).

## 2018-02-10 MED ORDER — IOPAMIDOL (ISOVUE-300) INJECTION 61%
100.0000 mL | Freq: Once | INTRAVENOUS | Status: AC | PRN
  Administered 2018-02-10: 100 mL via INTRAVENOUS

## 2018-02-13 ENCOUNTER — Ambulatory Visit: Payer: PPO | Admitting: Radiation Oncology

## 2018-02-13 ENCOUNTER — Ambulatory Visit: Payer: PPO

## 2018-02-14 ENCOUNTER — Encounter: Payer: Self-pay | Admitting: Radiation Oncology

## 2018-02-14 NOTE — Progress Notes (Signed)
GU Location of Tumor / Histology: prostatic adenocarcinoma  If Prostate Cancer, Gleason Score is (3 + 4) and PSA is (20.2). Prostate volume: 28.6 grams.   David White had a high PSA years ago but doesn't recall the level. PSA 11/04/2017 was 17. No LUTS except nocturia x1 depending on how many sodas he drinks. Patient reports, "this all began when I had testicular swelling after an incident at work" in May 2019.  Repeat PSA 2 months later was 20.2. Confirms he has only had one prostate biopsy.   11/04/2017 PSA  14.2   Biopsies of prostate (if applicable) revealed:     Past/Anticipated interventions by urology, if any: prostate biopsy, CT abd/pelvis (negative), bone scan (negative)  Past/Anticipated interventions by medical oncology, if any: no  Weight changes, if any: no  Bowel/Bladder complaints, if any: IPSS 3. Reports erectile dysfunction. Denies urinary incontinence. Denies dysuria, hematuria, urinary leakage or incontinence.   Nausea/Vomiting, if any: no  Pain issues, if any:  no  SAFETY ISSUES:  Prior radiation? no  Pacemaker/ICD? no  Possible current pregnancy? no  Is the patient on methotrexate? no  Current Complaints / other details:  67 year old male. Widowed. Smokers. 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

## 2018-02-15 ENCOUNTER — Ambulatory Visit: Payer: PPO | Admitting: Urology

## 2018-02-15 DIAGNOSIS — C61 Malignant neoplasm of prostate: Secondary | ICD-10-CM

## 2018-02-15 DIAGNOSIS — N5201 Erectile dysfunction due to arterial insufficiency: Secondary | ICD-10-CM | POA: Diagnosis not present

## 2018-02-16 ENCOUNTER — Other Ambulatory Visit: Payer: Self-pay

## 2018-02-16 ENCOUNTER — Encounter: Payer: Self-pay | Admitting: Radiation Oncology

## 2018-02-16 ENCOUNTER — Ambulatory Visit
Admission: RE | Admit: 2018-02-16 | Discharge: 2018-02-16 | Disposition: A | Discharge status: Home / Self Care | Payer: PPO | Source: Ambulatory Visit | Attending: Radiation Oncology | Admitting: Radiation Oncology

## 2018-02-16 ENCOUNTER — Encounter: Payer: Self-pay | Admitting: Medical Oncology

## 2018-02-16 DIAGNOSIS — Z885 Allergy status to narcotic agent status: Secondary | ICD-10-CM | POA: Diagnosis not present

## 2018-02-16 DIAGNOSIS — R932 Abnormal findings on diagnostic imaging of liver and biliary tract: Secondary | ICD-10-CM | POA: Diagnosis not present

## 2018-02-16 DIAGNOSIS — C61 Malignant neoplasm of prostate: Secondary | ICD-10-CM

## 2018-02-16 DIAGNOSIS — Z79899 Other long term (current) drug therapy: Secondary | ICD-10-CM | POA: Diagnosis not present

## 2018-02-16 DIAGNOSIS — R972 Elevated prostate specific antigen [PSA]: Secondary | ICD-10-CM | POA: Diagnosis not present

## 2018-02-16 DIAGNOSIS — F17210 Nicotine dependence, cigarettes, uncomplicated: Secondary | ICD-10-CM | POA: Insufficient documentation

## 2018-02-16 HISTORY — DX: Malignant neoplasm of prostate: C61

## 2018-02-16 NOTE — Progress Notes (Signed)
Radiation Oncology         [phone] ________________________________  Initial outpatient Consultation  Name: David White MRN: [id number]  Date: 02/16/2018  DOB: 1950-10-28  CC:Redmond School, MD  McKenzie, Candee Furbish, MD   REFERRING PHYSICIAN: Alyson Ingles Candee Furbish, MD  DIAGNOSIS: The encounter diagnosis was Malignant neoplasm of prostate [facility]).    ICD-10-CM   1. Malignant neoplasm of prostate ([place]) C61     HISTORY OF PRESENT ILLNESS: David White is a 67 y.o. male with a diagnosis of prostate cancer. He reports a longstanding history of elevated PSA, monitored by his PCP.  He denies ever having previous urologic evaluation or biopsy for elevated PSA.  In April 2019, he was noted to have an elevated PSA of 17 by his primary care physician, Dr. Redmond School, MD.  Accordingly, he was referred for evaluation in urology by Dr. Nicolette Bang MD on November 04, 2017,  digital rectal examination was performed at that time revealing no prostate nodules and a repeat PSA at that time was 14.2.  Repeat PSA 1 month later on 12/02/2017 was 20.2.  Therefore, the patient proceeded to transrectal ultrasound with 12 biopsies of the prostate under MAC on 01/09/18.  The prostate volume measured 28.6 cc.  Out of 12 core biopsies,4 were positive.  The maximum Gleason score was 4+3, and this was seen in the right apex lateral.  Additionally, there was Gleason 3+4 disease in the left apex, left apex lateral and right apex.  A bone scan on 01/20/18 showed 2 small foci of increased radiotracer uptake in the pelvis at the posterior left iliac bone and posterior aspect of the left inferior pubic ramus of unknown significance.  Further evaluation with CT A/P on 02/10/18 did not show any concerning findings for metastatic disease in the abdomen or pelvis.  PSA 10/21/17 17 11/04/17 14.2 12/02/17 20.2    The patient reviewed the biopsy results with his urologist and he has kindly been referred today for discussion  of potential radiation treatment options.  PREVIOUS RADIATION THERAPY: No  PAST MEDICAL HISTORY:  Past Medical History:  Diagnosis Date  . Anxiety   . Arthritis   . Hypercholesteremia   . Prostate cancer ([place])   . Tobacco abuse       PAST SURGICAL HISTORY: Past Surgical History:  Procedure Laterality Date  . APPENDECTOMY    . PROSTATE BIOPSY N/A 01/09/2018   Procedure: BIOPSY TRANSRECTAL ULTRASONIC PROSTATE (TUBP);  Surgeon: Cleon Gustin, MD;  Location: [facility];  Service: Urology;  Laterality: N/A;  30 MINS  . removal of cyst     back of head    FAMILY HISTORY:  Family History  Problem Relation Age of Onset  . Lung cancer Father   . Prostate cancer Neg Hx   . Colon cancer Neg Hx   . Pancreatic cancer Neg Hx   . Breast cancer Neg Hx     SOCIAL HISTORY:  Social History   Socioeconomic History  . Marital status: Widowed    Spouse name: Not on file  . Number of children: Not on file  . Years of education: Not on file  . Highest education level: Not on file  Occupational History  . Occupation: Librarian, academic  Social Needs  . Financial resource strain: Not on file  . Food insecurity:    Worry: Not on file    Inability: Not on file  . Transportation needs:    Medical: Not on file  Non-medical: Not on file  Tobacco Use  . Smoking status: Current Every Day Smoker    Packs/day: 1.50    Years: 40.00    Pack years: 60.00    Types: Cigarettes  . Smokeless tobacco: Never Used  Substance and Sexual Activity  . Alcohol use: Yes    Comment: occassional  . Drug use: Never  . Sexual activity: Yes    Birth control/protection: None  Lifestyle  . Physical activity:    Days per week: Not on file    Minutes per session: Not on file  . Stress: Not on file  Relationships  . Social connections:    Talks on phone: Not on file    Gets together: Not on file    Attends religious service: Not on file    Active member of club or organization: Not on file    Attends  meetings of clubs or organizations: Not on file    Relationship status: Not on file  . Intimate partner violence:    Fear of current or ex partner: Not on file    Emotionally abused: Not on file    Physically abused: Not on file    Forced sexual activity: Not on file  Other Topics Concern  . Not on file  Social History Narrative  . Not on file    ALLERGIES: Codeine  MEDICATIONS:  Current Outpatient Medications  Medication Sig Dispense Refill  . ALPRAZolam (XANAX) 0.5 MG tablet Take 1 tablet (0.5 mg total) by mouth 3 (three) times daily as needed for anxiety or sleep. 20 tablet 0  . atorvastatin (LIPITOR) 20 MG tablet Take 20 mg by mouth daily.  11  . Coenzyme Q10 (COQ10) 100 MG CAPS Take 1 capsule by mouth daily.    Marland Kitchen ibuprofen (ADVIL,MOTRIN) 800 MG tablet ibuprofen 800 mg tablet    . meloxicam (MOBIC) 7.5 MG tablet Take 7.5 mg by mouth daily.    . tadalafil (CIALIS) 20 MG tablet Cialis 20 mg tablet     No current facility-administered medications for this encounter.     REVIEW OF SYSTEMS:  On review of systems, the patient reports that he is doing well overall. He denies any chest pain, shortness of breath, cough, fevers, chills, night sweats, unintended weight changes. He denies any bowel disturbances, and denies abdominal pain, nausea or vomiting.  He reports that his bowel habits are very regular.  He denies any new musculoskeletal or joint aches or pains. His IPSS was 3, indicating mild urinary symptoms. He denies erectile dysfunction and is able to complete sexual activity with most attempts. A complete review of systems is obtained and is otherwise negative.  PHYSICAL EXAM:  Wt Readings from Last 3 Encounters:  02/16/18 163 lb 3.2 oz (74 kg)  01/09/18 166 lb (75.3 kg)  01/05/18 166 lb (75.3 kg)   Temp Readings from Last 3 Encounters:  02/16/18 98.6 F (37 C) (Oral)  01/09/18 97.8 F (36.6 C) (Oral)  01/05/18 98 F (36.7 C) (Oral)   BP Readings from Last 3  Encounters:  02/16/18 (!) 142/73  01/09/18 118/62  01/05/18 (!) 155/82   Pulse Readings from Last 3 Encounters:  02/16/18 77  01/09/18 68  01/05/18 96   Pain Assessment Pain Score: 0-No pain/10  In general this is a well appearing caucasian gentleman in no acute distress. He is alert and oriented x4 and appropriate throughout the examination. HEENT reveals that the patient is normocephalic, atraumatic. EOMs are intact. PERRLA. Skin is intact  without any evidence of gross lesions. Cardiovascular exam reveals a regular rate and rhythm, no clicks rubs or murmurs are auscultated. Chest is clear to auscultation bilaterally. Lymphatic assessment is performed and does not reveal any adenopathy in the cervical, supraclavicular, axillary, or inguinal chains. Abdomen has active bowel sounds in all quadrants and is intact. The abdomen is soft, non tender, non distended. Lower extremities are negative for pretibial pitting edema, deep calf tenderness, cyanosis or clubbing.   KPS = 100  100 - Normal; no complaints; no evidence of disease. 90   - Able to carry on normal activity; minor signs or symptoms of disease. 80   - Normal activity with effort; some signs or symptoms of disease. 27   - Cares for self; unable to carry on normal activity or to do active work. 60   - Requires occasional assistance, but is able to care for most of his personal needs. 50   - Requires considerable assistance and frequent medical care. 26   - Disabled; requires special care and assistance. 76   - Severely disabled; hospital admission is indicated although death not imminent. 51   - Very sick; hospital admission necessary; active supportive treatment necessary. 10   - Moribund; fatal processes progressing rapidly. 0     - Dead  Karnofsky DA, Abelmann [place], Craver LS and Burchenal JH [phone]) The use of the nitrogen mustards in the palliative treatment of carcinoma: with particular reference to bronchogenic carcinoma Cancer  1 [id number]  LABORATORY DATA:  Lab Results  Component Value Date   WBC 11.4 (H) 12/01/2017   HGB 13.8 12/01/2017   HCT 41.5 12/01/2017   MCV 93.3 12/01/2017   PLT 262 12/01/2017   Lab Results  Component Value Date   NA 139 12/01/2017   K 3.9 12/01/2017   CL 106 12/01/2017   CO2 23 12/01/2017   Lab Results  Component Value Date   ALT 19 12/01/2017   AST 20 12/01/2017   ALKPHOS 124 12/01/2017   BILITOT 0.7 12/01/2017     RADIOGRAPHY: Ct Abdomen Pelvis W Wo Contrast  Result Date: 02/10/2018 CLINICAL DATA:  Prostate cancer EXAM: CT ABDOMEN AND PELVIS WITHOUT AND WITH CONTRAST TECHNIQUE: Multidetector CT imaging of the abdomen and pelvis was performed following the standard protocol before and following the bolus administration of intravenous contrast. CONTRAST:  170m ISOVUE-300 IOPAMIDOL (ISOVUE-300) INJECTION 61% COMPARISON:  Nuclear medicine bone scan 01/20/2018 FINDINGS: Lower chest: Lung bases are clear. Hepatobiliary: No focal hepatic lesion. No biliary duct dilatation. Gallbladder is normal. Common bile duct is normal. Pancreas: Pancreas is normal. No ductal dilatation. No pancreatic inflammation. Spleen: Normal spleen Adrenals/urinary tract: Adrenal glands normal. Bilateral simple fluid renal cysts. Ureters and bladder normal. Stomach/Bowel: Stomach, small bowel, appendix, and cecum are normal. The colon and rectosigmoid colon are normal. Vascular/Lymphatic: Abdominal aorta is normal caliber with atherosclerotic calcification. There is no retroperitoneal or periportal lymphadenopathy. No pelvic lymphadenopathy. Reproductive: Prostate normal. Other: No free fluid. Musculoskeletal: There is no lesion within the LEFT hemipelvis to correspond to the indeterminate lesions on nuclear medicine bone scan. Aggressive lesions elsewhere. Degenerate changes in the lower lumbar spine. Remote posterior LEFT rib fractures IMPRESSION: 1. No evidence of prostate cancer metastasis within the bones. 2. No  lymphadenopathy 3.  Aortic Atherosclerosis (ICD10-I70.0). Electronically Signed   By: SSuzy BouchardM.D.   On: 02/10/2018 10:11   Nm Bone Scan Whole Body  Result Date: 01/20/2018 CLINICAL DATA:  Malignant neoplasm of the prostate gland. EXAM: NUCLEAR  MEDICINE WHOLE BODY BONE SCAN TECHNIQUE: Whole body anterior and posterior images were obtained approximately 3 hours after intravenous injection of radiopharmaceutical. RADIOPHARMACEUTICALS:  18.9 mCi Technetium-39mMDP IV COMPARISON:  05/07/2010 FINDINGS: There is a small focus of increased uptake within the posterior aspect of the left iliac bone. There is also a small focus of increased uptake localizing to the posterior aspect of the left inferior pubic rami.Small foci of increased uptake localizing to the posterior aspect of the left ninth and tenth ribs are likely posttraumatic. Degenerative type changes are identified within the lumbosacral region. Degenerative type change noted within the shoulders and knees. There is normal physiologic tracer activity within both kidneys an urinary bladder. IMPRESSION: No specific findings identified highly suspicious for bone metastasis. Two small foci of increased uptake within the left hemipelvis are noted posteriorly. Indeterminate. In the setting of new diagnosis of prostate cancer and elevated PSA consider further evaluation with CT of the pelvis and/or correlation with plain film radiographs of the pelvis. Electronically Signed   By: TKerby MoorsM.D.   On: 01/20/2018 15:41      IMPRESSION/PLAN: 1. 67y.o. gentleman with Stage T1c adenocarcinoma of the prostate with Gleason Score of 3+4, and PSA of 20.2.  We discussed the patient's workup and outlined the nature of prostate cancer in this setting. The patient's PSA technically puts him into the high risk group since it is higher than 20, however, since it is only 20.2, it may be reasonable to consider him unfavorable intermediate risk.  Accordingly, he is  eligible for a variety of potential treatment options including brachytherapy, 8 weeks of external radiation or 5 weeks of external radiation followed by a brachytherapy boost. We discussed the available radiation techniques, and focused on the details and logistics and delivery. We discussed and outlined the risks, benefits, short and long-term effects associated with radiotherapy and compared and contrasted these with prostatectomy.  We discussed the recommendation, based on his PSA greater than 20, is to proceed with short-term androgen deprivation therapy in combination with 5 weeks of external beam radiotherapy followed by a radioactive seed boost to ensure the highest probability for cure.   [place] for [place] to [place].   We detailed the role of ADT in the treatment of high risk prostate cancer and outlined the associated side effects that could be expected with this therapy.  The patient would like to avoid ADT at this time.  We also discussed the role of SpaceOAR in reducing the rectal toxicity associated with radiotherapy.  The patient was encouraged to ask questions that were answered to his stated satisfaction.  He appears to have a good understanding of his disease and our recommendations and he accepts the increased risk of disease recurrence associated with proceeding with brachytherapy alone.  At the conclusion of our conversation, the patient is interested in moving forward with brachytherapy and use of SpaceOAR to reduce rectal toxicity from radiotherapy.  We will share our discussion with Dr. MAlyson Inglesand move forward with [facility].  The patient met briefly with SRomie Jumperin our office who will be working closely with him to coordinate OR scheduling and pre and post procedure  appointments.  We will contact the pharmaceutical rep to ensure that SPine Ridgeis available at the time of procedure.  He  will have a prostate MRI following his post-seed CT SIM to confirm appropriate distribution of the [place].  ----------------------------------------------------------------------------  Nicholos Johns, PA-C    [name] Pita, MD  [place]: [phone]  Fax: [phone] [email or web address]  Skype  LinkedIn  This document serves as a record of services personally performed by [name] Pita, MD and Ashlyn Bruning PA-C. It was created on their behalf by Delton Coombes, a trained medical scribe. The creation of this record is based on the scribe's personal observations and the provider's statements to them.

## 2018-02-16 NOTE — Progress Notes (Signed)
See progress note under physician encounter. 

## 2018-02-20 NOTE — Progress Notes (Signed)
Introduced myself to David White as the prostate nurse navigator and my role.  He states that he had an accident at work in May  that caused  testicular pain. He to MD to be evaluated and had a PSA which resulted elevated. He is most interested in brachytherapy. I gave him my business card and asked him to call me with questions or concerns. He voiced understanding.

## 2018-02-21 ENCOUNTER — Telehealth: Payer: Self-pay | Admitting: *Deleted

## 2018-02-21 NOTE — Telephone Encounter (Signed)
CALLED PATIENT TO INFORM OF PRE-SEED PLANNING CT FOR 03-03-18, SPOKE WITH PATIENT AND HE IS AWARE OF THESE APPTS.

## 2018-03-03 ENCOUNTER — Ambulatory Visit
Admission: RE | Admit: 2018-03-03 | Discharge: 2018-03-03 | Disposition: A | Discharge status: Home / Self Care | Payer: PPO | Source: Ambulatory Visit | Attending: Radiation Oncology | Admitting: Radiation Oncology

## 2018-03-03 DIAGNOSIS — C61 Malignant neoplasm of prostate: Secondary | ICD-10-CM

## 2018-03-03 NOTE — Progress Notes (Signed)
  Radiation Oncology         [phone] ________________________________  Name: YAPHET SMETHURST MRN: [id number]  Date: 03/03/2018  DOB: [birth year]  SIMULATION AND TREATMENT PLANNING NOTE PUBIC ARCH STUDY  OT:LXBWI, Purcell Nails, MD  Cleon Gustin, MD  DIAGNOSIS: 67 y.o. gentleman with Stage T1c adenocarcinoma of the prostate with Gleason Score of 3+4, and PSA of 20.2     ICD-10-CM   1. Malignant neoplasm of prostate ([place]) C61     COMPLEX SIMULATION:  The patient presented today for evaluation for possible prostate seed implant. He was brought to the radiation planning suite and placed supine on the CT couch. A 3-dimensional image study set was obtained in upload to the planning computer. There, on each axial slice, I contoured the prostate gland. Then, using three-dimensional radiation planning tools I reconstructed the prostate in view of the structures from the transperineal needle pathway to assess for possible pubic arch interference. In doing so, I did not appreciate any pubic arch interference. Also, the patient's prostate volume was estimated based on the drawn structure. The volume was 29 cc.  Given the pubic arch appearance and prostate volume, patient remains a good candidate to proceed with prostate seed implant. Today, he freely provided informed written consent to proceed.    PLAN: The patient will undergo prostate seed implant.   ________________________________  Sheral Apley. Tammi Klippel, M.D.

## 2018-03-14 ENCOUNTER — Telehealth: Payer: Self-pay | Admitting: *Deleted

## 2018-03-14 NOTE — Telephone Encounter (Signed)
CALLED PATIENT TO INFORM OF IMPLANT DATE, LVM FOR A RETURN CALL 

## 2018-03-15 ENCOUNTER — Telehealth: Payer: Self-pay | Admitting: *Deleted

## 2018-03-15 NOTE — Telephone Encounter (Signed)
Called patient to inform of lab for 03-17-18 @ 9 am - pt. to report to [facility], spoke with patient and he is aware of this appt.

## 2018-03-17 ENCOUNTER — Ambulatory Visit (HOSPITAL_COMMUNITY)
Admission: RE | Admit: 2018-03-17 | Discharge: 2018-03-17 | Disposition: A | Discharge status: Home / Self Care | Payer: PPO | Source: Ambulatory Visit | Attending: Urology | Admitting: Urology

## 2018-03-17 ENCOUNTER — Encounter (HOSPITAL_COMMUNITY)
Admission: RE | Admit: 2018-03-17 | Discharge: 2018-03-17 | Disposition: A | Discharge status: Home / Self Care | Payer: PPO | Source: Ambulatory Visit | Attending: Urology | Admitting: Urology

## 2018-03-17 DIAGNOSIS — Z01818 Encounter for other preprocedural examination: Secondary | ICD-10-CM | POA: Diagnosis not present

## 2018-03-17 DIAGNOSIS — J40 Bronchitis, not specified as acute or chronic: Secondary | ICD-10-CM | POA: Diagnosis not present

## 2018-03-17 DIAGNOSIS — J984 Other disorders of lung: Secondary | ICD-10-CM | POA: Diagnosis not present

## 2018-03-17 IMAGING — DX DG CHEST 2V
2 series · 2 of 2 positions shown · non-contrast
Comparison: None.

CLINICAL DATA: preop seed implant; no chest hx; smoker

EXAM:
CHEST - 2 VIEW

[chest pa]
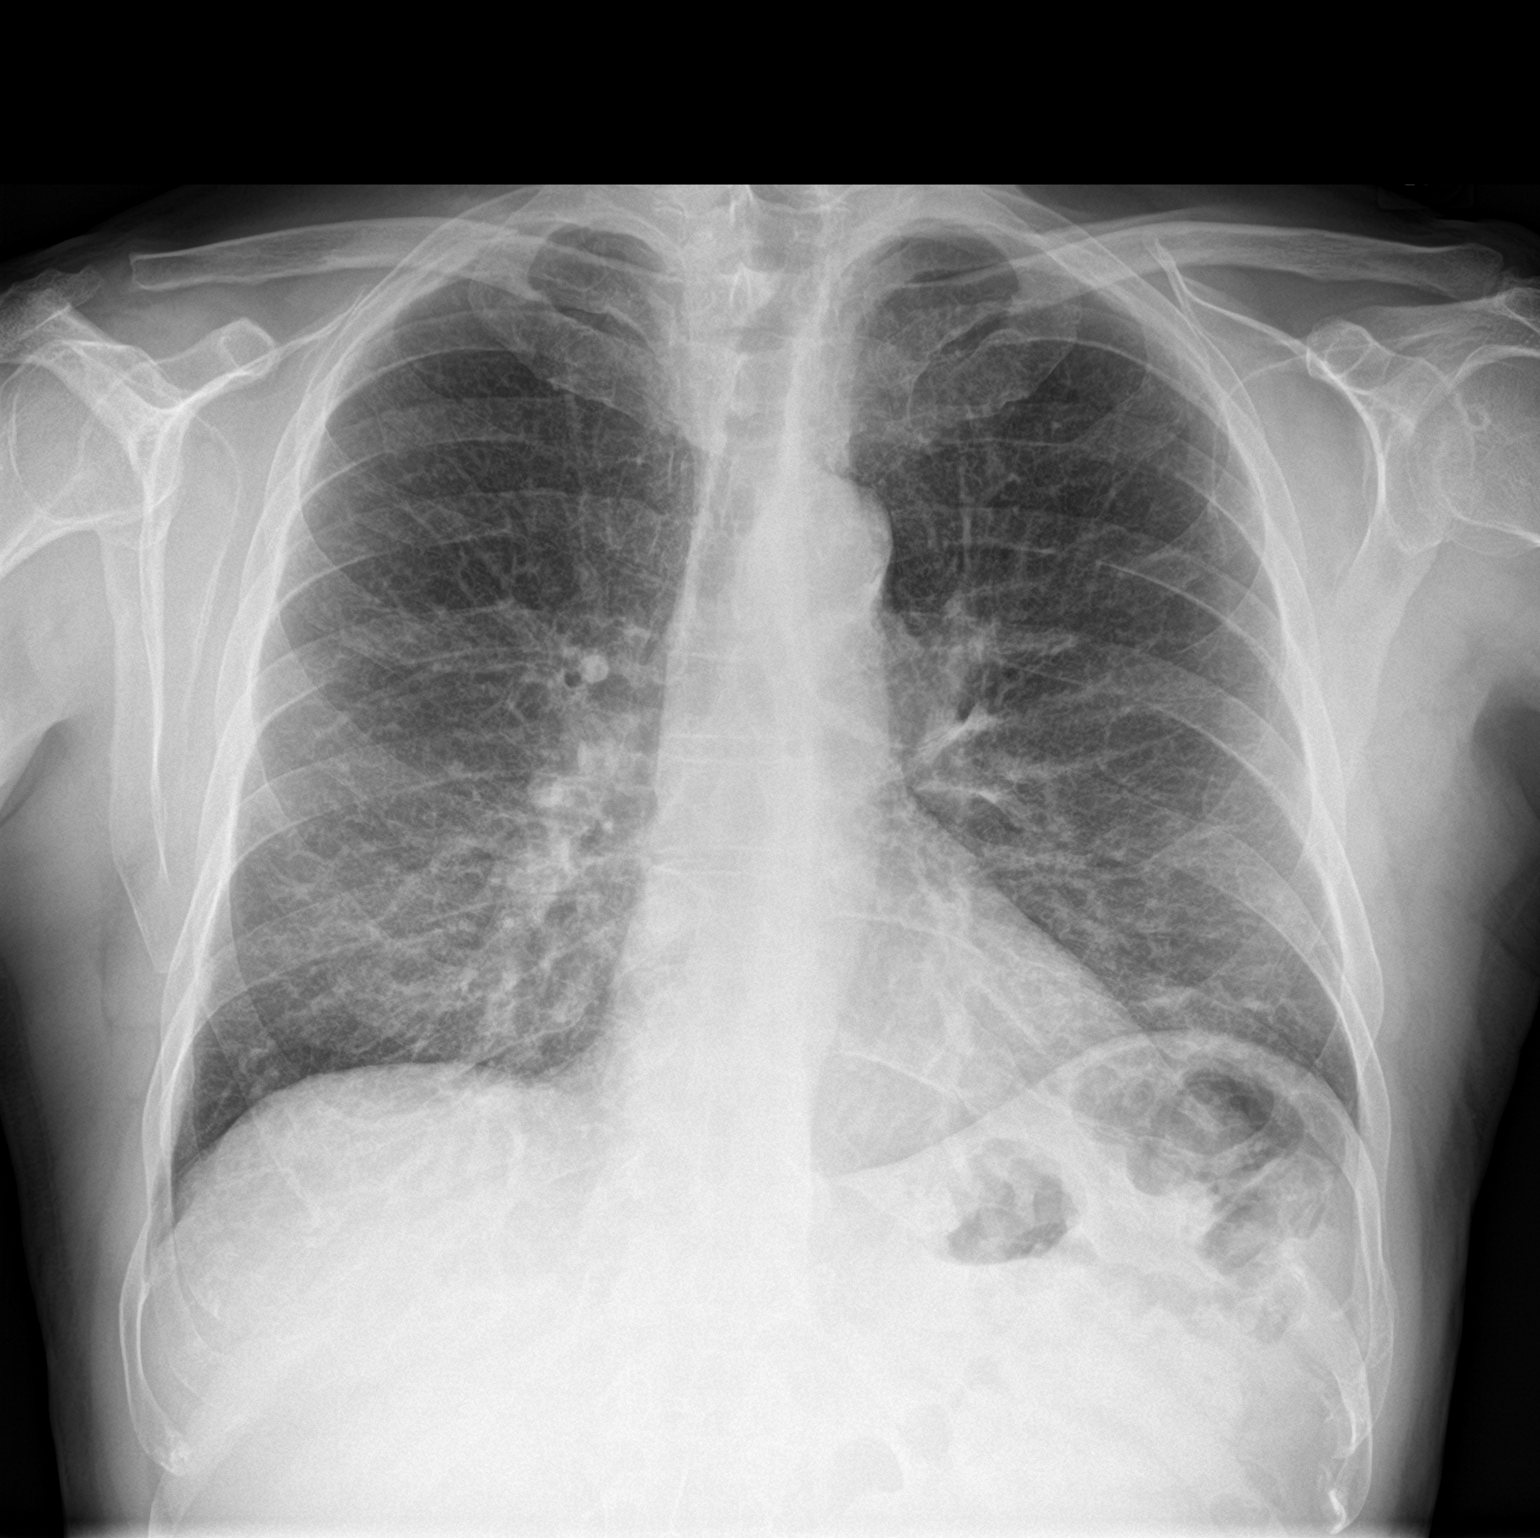

[chest lat]
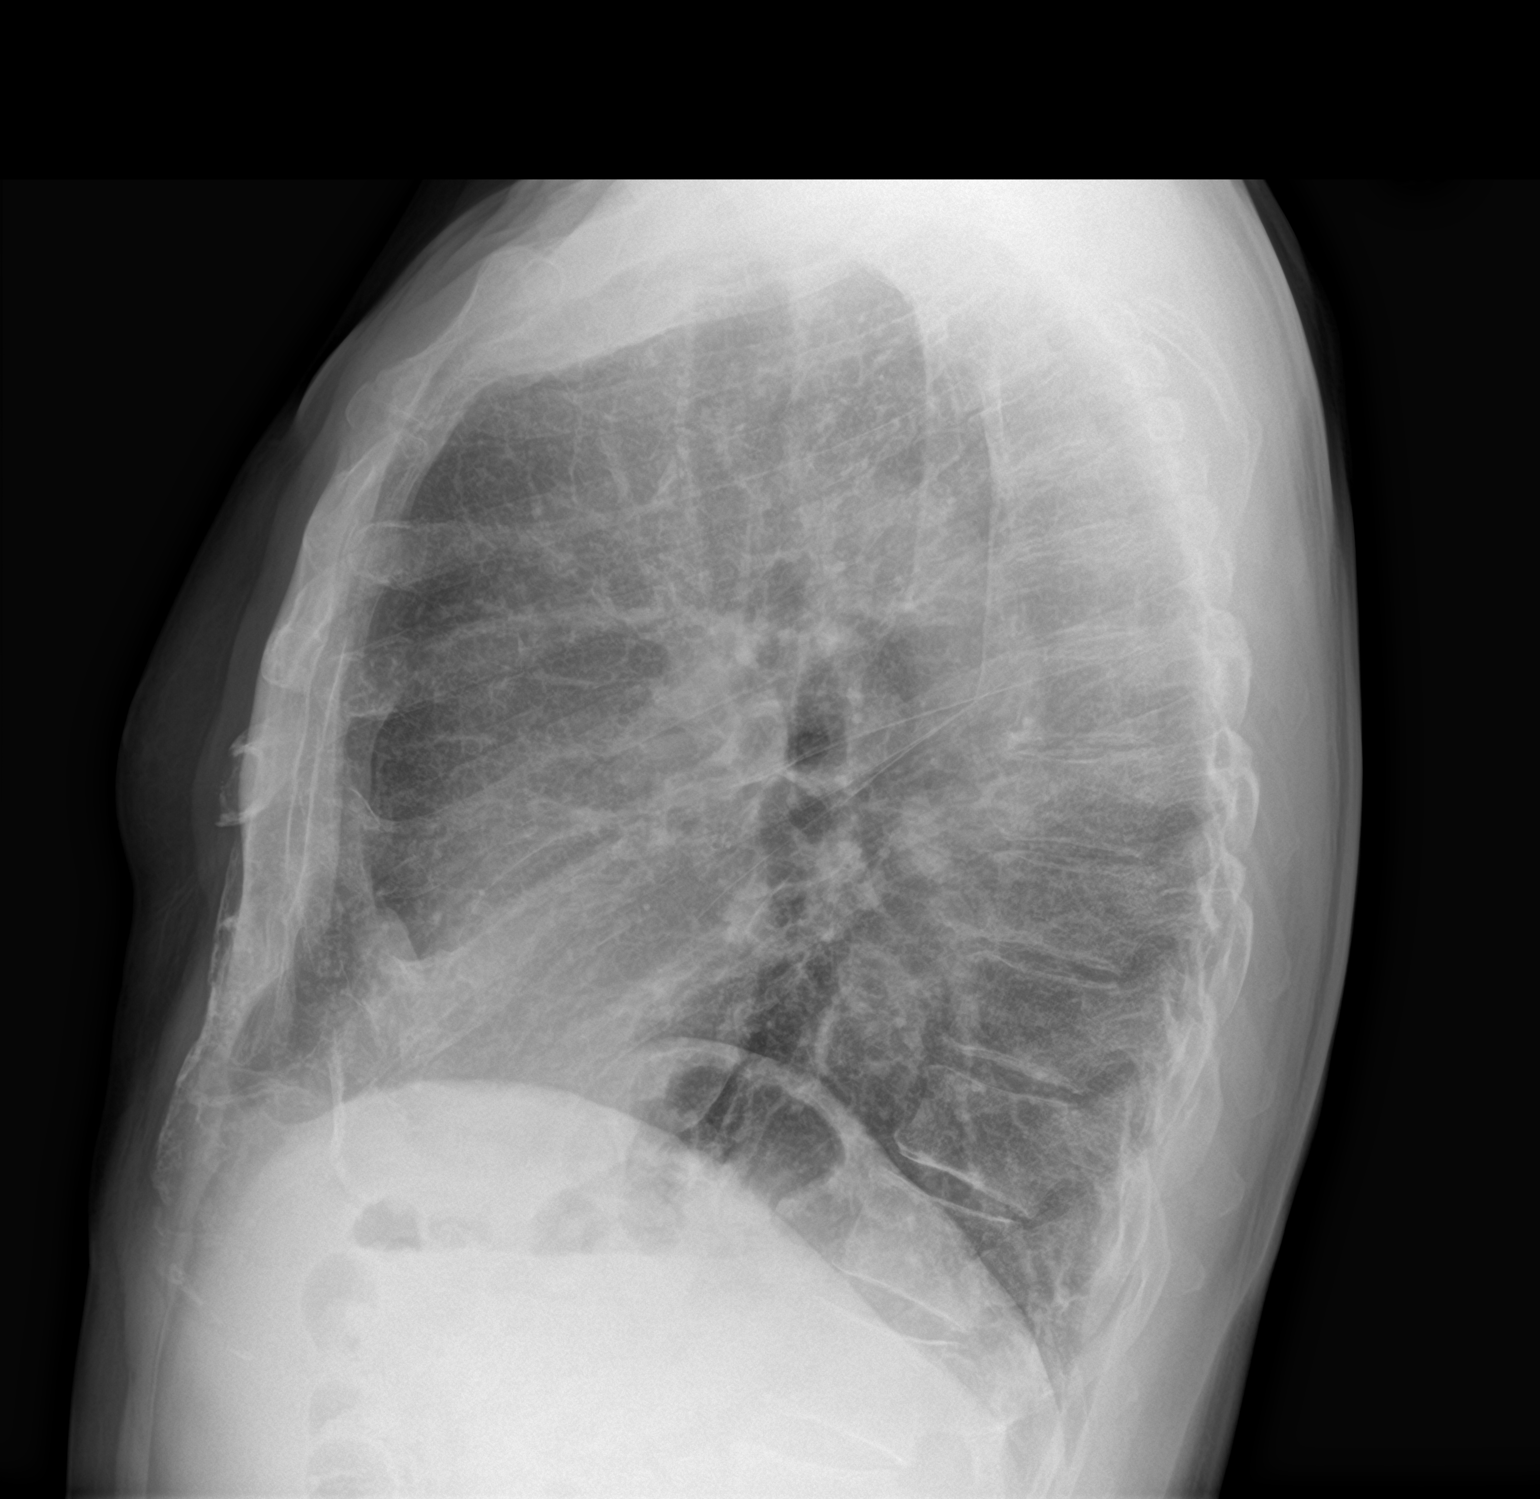

[2 of 2 positions shown; findings below may reference images not displayed]

FINDINGS: Heart size and mediastinal contours are within normal limits.

Chronic bronchitic changes noted. Coarse lung markings bilaterally
suggests chronic interstitial lung disease. No suspicious nodule or
mass. No pleural effusion or pneumothorax seen. No acute or
suspicious osseous finding.
IMPRESSION: 1. No active cardiopulmonary disease.
2. Chronic bronchitic changes and probable chronic interstitial lung
disease/fibrosis.

## 2018-04-06 DIAGNOSIS — Z1389 Encounter for screening for other disorder: Secondary | ICD-10-CM | POA: Diagnosis not present

## 2018-04-06 DIAGNOSIS — Z0000 Encounter for general adult medical examination without abnormal findings: Secondary | ICD-10-CM | POA: Diagnosis not present

## 2018-04-06 DIAGNOSIS — R5383 Other fatigue: Secondary | ICD-10-CM | POA: Diagnosis not present

## 2018-04-06 DIAGNOSIS — G4700 Insomnia, unspecified: Secondary | ICD-10-CM | POA: Diagnosis not present

## 2018-04-06 DIAGNOSIS — Z6823 Body mass index (BMI) 23.0-23.9, adult: Secondary | ICD-10-CM | POA: Diagnosis not present

## 2018-04-18 ENCOUNTER — Other Ambulatory Visit: Payer: Self-pay | Admitting: Urology

## 2018-04-18 DIAGNOSIS — C61 Malignant neoplasm of prostate: Secondary | ICD-10-CM

## 2018-04-20 ENCOUNTER — Telehealth: Payer: Self-pay | Admitting: *Deleted

## 2018-04-20 NOTE — Telephone Encounter (Signed)
CALLED PATIENT TO INFORM OF LAB APPT. FOR 04-21-18 - ARRIVAL TIME - 10:45 AM @ [facility] ADMITTING, SPOKE WITH PATIENT AND HE IS AWARE OF THIS APPT.

## 2018-04-21 ENCOUNTER — Other Ambulatory Visit: Payer: Self-pay | Admitting: Urology

## 2018-04-21 ENCOUNTER — Encounter (HOSPITAL_COMMUNITY)
Admission: RE | Admit: 2018-04-21 | Discharge: 2018-04-21 | Disposition: A | Discharge status: Home / Self Care | Payer: PPO | Source: Ambulatory Visit | Attending: Urology | Admitting: Urology

## 2018-04-21 DIAGNOSIS — C61 Malignant neoplasm of prostate: Secondary | ICD-10-CM | POA: Insufficient documentation

## 2018-04-21 DIAGNOSIS — Z01818 Encounter for other preprocedural examination: Secondary | ICD-10-CM | POA: Diagnosis not present

## 2018-04-21 DIAGNOSIS — Z79899 Other long term (current) drug therapy: Secondary | ICD-10-CM | POA: Diagnosis not present

## 2018-04-21 DIAGNOSIS — Z791 Long term (current) use of non-steroidal anti-inflammatories (NSAID): Secondary | ICD-10-CM | POA: Insufficient documentation

## 2018-04-21 LAB — COMPREHENSIVE METABOLIC PANEL
ALK PHOS: 119 U/L (ref 38–126)
ALT: 14 U/L (ref 0–44)
AST: 21 U/L (ref 15–41)
Albumin: 4 g/dL (ref 3.5–5.0)
Anion gap: 10 (ref 5–15)
BUN: 10 mg/dL (ref 8–23)
CO2: 26 mmol/L (ref 22–32)
CREATININE: 0.83 mg/dL (ref 0.61–1.24)
Calcium: 9.2 mg/dL (ref 8.9–10.3)
Chloride: 102 mmol/L (ref 98–111)
GFR calc non Af Amer: >60 mL/min (ref >60)
GLUCOSE: 81 mg/dL (ref 70–99)
Potassium: 4.4 mmol/L (ref 3.5–5.1)
SODIUM: 138 mmol/L (ref 135–145)
Total Bilirubin: 0.4 mg/dL (ref 0.3–1.2)
Total Protein: 7.7 g/dL (ref 6.5–8.1)

## 2018-04-21 LAB — APTT: aPTT: 33 seconds (ref 24–36)

## 2018-04-21 LAB — PROTIME-INR
INR: 0.9
Prothrombin Time: 12.1 seconds (ref 11.4–15.2)

## 2018-04-21 LAB — CBC
HEMATOCRIT: 45.2 % (ref 39.0–52.0)
Hemoglobin: 14.6 g/dL (ref 13.0–17.0)
MCH: 31.4 pg (ref 26.0–34.0)
MCHC: 32.3 g/dL (ref 30.0–36.0)
MCV: 97.2 fL (ref 80.0–100.0)
NRBC: 0 % (ref 0.0–0.2)
Platelets: 286 10*3/uL (ref 150–400)
RBC: 4.65 MIL/uL (ref 4.22–5.81)
RDW: 13.2 % (ref 11.5–15.5)
WBC: 11.9 10*3/uL — AB (ref 4.0–10.5)

## 2018-04-24 ENCOUNTER — Encounter (HOSPITAL_BASED_OUTPATIENT_CLINIC_OR_DEPARTMENT_OTHER): Payer: Self-pay | Admitting: *Deleted

## 2018-04-24 ENCOUNTER — Other Ambulatory Visit: Payer: Self-pay

## 2018-04-24 NOTE — Progress Notes (Signed)
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

## 2018-04-27 ENCOUNTER — Telehealth: Payer: Self-pay | Admitting: *Deleted

## 2018-04-27 NOTE — Telephone Encounter (Signed)
Called patient to remind of procedure for 04-28-18, spoke with patient and he is aware of this procedure.

## 2018-04-28 ENCOUNTER — Ambulatory Visit (HOSPITAL_BASED_OUTPATIENT_CLINIC_OR_DEPARTMENT_OTHER)
Admission: RE | Admit: 2018-04-28 | Discharge: 2018-04-28 | Disposition: A | Discharge status: Home / Self Care | Payer: PPO | Source: Ambulatory Visit | Attending: Urology | Admitting: Urology

## 2018-04-28 ENCOUNTER — Ambulatory Visit (HOSPITAL_BASED_OUTPATIENT_CLINIC_OR_DEPARTMENT_OTHER): Payer: PPO | Admitting: Anesthesiology

## 2018-04-28 ENCOUNTER — Encounter (HOSPITAL_BASED_OUTPATIENT_CLINIC_OR_DEPARTMENT_OTHER): Payer: Self-pay | Admitting: Anesthesiology

## 2018-04-28 ENCOUNTER — Encounter (HOSPITAL_BASED_OUTPATIENT_CLINIC_OR_DEPARTMENT_OTHER)
Admission: RE | Disposition: A | Discharge status: Home / Self Care | Payer: Self-pay | Source: Ambulatory Visit | Attending: Urology

## 2018-04-28 ENCOUNTER — Ambulatory Visit (HOSPITAL_COMMUNITY): Payer: PPO

## 2018-04-28 DIAGNOSIS — Z885 Allergy status to narcotic agent status: Secondary | ICD-10-CM | POA: Diagnosis not present

## 2018-04-28 DIAGNOSIS — C61 Malignant neoplasm of prostate: Secondary | ICD-10-CM | POA: Insufficient documentation

## 2018-04-28 DIAGNOSIS — Z79899 Other long term (current) drug therapy: Secondary | ICD-10-CM | POA: Insufficient documentation

## 2018-04-28 DIAGNOSIS — F17210 Nicotine dependence, cigarettes, uncomplicated: Secondary | ICD-10-CM | POA: Insufficient documentation

## 2018-04-28 DIAGNOSIS — M1990 Unspecified osteoarthritis, unspecified site: Secondary | ICD-10-CM | POA: Diagnosis not present

## 2018-04-28 DIAGNOSIS — F419 Anxiety disorder, unspecified: Secondary | ICD-10-CM | POA: Diagnosis not present

## 2018-04-28 DIAGNOSIS — E7800 Pure hypercholesterolemia, unspecified: Secondary | ICD-10-CM | POA: Diagnosis not present

## 2018-04-28 HISTORY — PX: SPACE OAR INSTILLATION: SHX6769

## 2018-04-28 HISTORY — DX: Unspecified macular degeneration: H35.30

## 2018-04-28 HISTORY — PX: RADIOACTIVE SEED IMPLANT: SHX5150

## 2018-04-28 HISTORY — PX: CYSTOSCOPY: SHX5120

## 2018-04-28 HISTORY — DX: Inflammatory liver disease, unspecified: K75.9

## 2018-04-28 SURGERY — INSERTION, RADIATION SOURCE, PROSTATE
Anesthesia: General | Site: Rectum

## 2018-04-28 MED ORDER — GLYCOPYRROLATE PF 0.2 MG/ML IJ SOSY
PREFILLED_SYRINGE | INTRAMUSCULAR | Status: AC
  Filled 2018-04-28: qty 1

## 2018-04-28 MED ORDER — MEPERIDINE HCL 25 MG/ML IJ SOLN
6.2500 mg | INTRAMUSCULAR | Status: DC | PRN
  Filled 2018-04-28: qty 1

## 2018-04-28 MED ORDER — LIDOCAINE 2% (20 MG/ML) 5 ML SYRINGE
INTRAMUSCULAR | Status: AC
  Filled 2018-04-28: qty 5

## 2018-04-28 MED ORDER — KETOROLAC TROMETHAMINE 30 MG/ML IJ SOLN
INTRAMUSCULAR | Status: DC | PRN
  Administered 2018-04-28: 30 mg via INTRAVENOUS

## 2018-04-28 MED ORDER — SODIUM CHLORIDE 0.9 % IR SOLN
Status: DC | PRN
  Administered 2018-04-28: 1000 mL via INTRAVESICAL

## 2018-04-28 MED ORDER — ACETAMINOPHEN 325 MG PO TABS
325.0000 mg | ORAL_TABLET | ORAL | Status: DC | PRN
  Filled 2018-04-28: qty 2

## 2018-04-28 MED ORDER — PHENYLEPHRINE HCL 10 MG/ML IJ SOLN
INTRAMUSCULAR | Status: AC
  Filled 2018-04-28: qty 1

## 2018-04-28 MED ORDER — ACETAMINOPHEN 160 MG/5ML PO SOLN
325.0000 mg | ORAL | Status: DC | PRN
  Filled 2018-04-28: qty 20.3

## 2018-04-28 MED ORDER — FLEET ENEMA 7-19 GM/118ML RE ENEM
1.0000 | ENEMA | Freq: Once | RECTAL | Status: DC
  Filled 2018-04-28: qty 1

## 2018-04-28 MED ORDER — CEFAZOLIN SODIUM-DEXTROSE 2-4 GM/100ML-% IV SOLN: 2.0000 g | Freq: Three times a day (TID) | INTRAVENOUS | Status: DC

## 2018-04-28 MED ORDER — ONDANSETRON HCL 4 MG/2ML IJ SOLN
INTRAMUSCULAR | Status: AC
  Filled 2018-04-28: qty 2

## 2018-04-28 MED ORDER — PHENYLEPHRINE HCL 10 MG/ML IJ SOLN
INTRAMUSCULAR | Status: DC | PRN
  Administered 2018-04-28: 40 ug via INTRAVENOUS

## 2018-04-28 MED ORDER — ONDANSETRON HCL 4 MG/2ML IJ SOLN
INTRAMUSCULAR | Status: DC | PRN
  Administered 2018-04-28: 4 mg via INTRAVENOUS

## 2018-04-28 MED ORDER — PROPOFOL 10 MG/ML IV BOLUS
INTRAVENOUS | Status: AC
  Filled 2018-04-28: qty 20

## 2018-04-28 MED ORDER — KETOROLAC TROMETHAMINE 30 MG/ML IJ SOLN
30.0000 mg | Freq: Once | INTRAMUSCULAR | Status: DC | PRN
  Filled 2018-04-28: qty 1

## 2018-04-28 MED ORDER — DEXAMETHASONE SODIUM PHOSPHATE 4 MG/ML IJ SOLN
INTRAMUSCULAR | Status: DC | PRN
  Administered 2018-04-28: 10 mg via INTRAVENOUS

## 2018-04-28 MED ORDER — CEFAZOLIN SODIUM-DEXTROSE 2-4 GM/100ML-% IV SOLN
2.0000 g | Freq: Once | INTRAVENOUS | Status: AC
  Administered 2018-04-28: 2 g via INTRAVENOUS
  Filled 2018-04-28: qty 100

## 2018-04-28 MED ORDER — LACTATED RINGERS IV SOLN
INTRAVENOUS | Status: DC
  Administered 2018-04-28 (×2): via INTRAVENOUS
  Filled 2018-04-28: qty 1000

## 2018-04-28 MED ORDER — DEXAMETHASONE SODIUM PHOSPHATE 10 MG/ML IJ SOLN
INTRAMUSCULAR | Status: AC
  Filled 2018-04-28: qty 1

## 2018-04-28 MED ORDER — SODIUM CHLORIDE 0.9 % IJ SOLN
INTRAMUSCULAR | Status: DC | PRN
  Administered 2018-04-28: 10 mL

## 2018-04-28 MED ORDER — FENTANYL CITRATE (PF) 100 MCG/2ML IJ SOLN
INTRAMUSCULAR | Status: AC
  Filled 2018-04-28: qty 2

## 2018-04-28 MED ORDER — OXYCODONE HCL 5 MG PO TABS
5.0000 mg | ORAL_TABLET | Freq: Once | ORAL | Status: DC | PRN
  Filled 2018-04-28: qty 1

## 2018-04-28 MED ORDER — EPHEDRINE 5 MG/ML INJ
INTRAVENOUS | Status: AC
  Filled 2018-04-28: qty 10

## 2018-04-28 MED ORDER — FENTANYL CITRATE (PF) 100 MCG/2ML IJ SOLN
INTRAMUSCULAR | Status: DC | PRN
  Administered 2018-04-28: 50 ug via INTRAVENOUS
  Administered 2018-04-28: 75 ug via INTRAVENOUS
  Administered 2018-04-28 (×3): 25 ug via INTRAVENOUS

## 2018-04-28 MED ORDER — OXYCODONE HCL 5 MG/5ML PO SOLN
5.0000 mg | Freq: Once | ORAL | Status: DC | PRN
  Filled 2018-04-28: qty 5

## 2018-04-28 MED ORDER — CEFAZOLIN SODIUM-DEXTROSE 2-4 GM/100ML-% IV SOLN
INTRAVENOUS | Status: AC
  Filled 2018-04-28: qty 100

## 2018-04-28 MED ORDER — GLYCOPYRROLATE 0.2 MG/ML IJ SOLN
INTRAMUSCULAR | Status: DC | PRN
  Administered 2018-04-28: 0.2 mg via INTRAVENOUS

## 2018-04-28 MED ORDER — IOHEXOL 300 MG/ML  SOLN
INTRAMUSCULAR | Status: DC | PRN
  Administered 2018-04-28: 7 mL

## 2018-04-28 MED ORDER — MIDAZOLAM HCL 2 MG/2ML IJ SOLN
INTRAMUSCULAR | Status: AC
  Filled 2018-04-28: qty 2

## 2018-04-28 MED ORDER — TRAMADOL HCL 50 MG PO TABS: 50.0000 mg | ORAL_TABLET | Freq: Four times a day (QID) | ORAL | 0 refills | Status: AC | PRN

## 2018-04-28 MED ORDER — EPHEDRINE SULFATE 50 MG/ML IJ SOLN
INTRAMUSCULAR | Status: DC | PRN
  Administered 2018-04-28: 20 mg via INTRAVENOUS
  Administered 2018-04-28 (×3): 10 mg via INTRAVENOUS

## 2018-04-28 MED ORDER — PROPOFOL 10 MG/ML IV BOLUS
INTRAVENOUS | Status: DC | PRN
  Administered 2018-04-28: 50 mg via INTRAVENOUS
  Administered 2018-04-28: 100 mg via INTRAVENOUS
  Administered 2018-04-28: 150 mg via INTRAVENOUS
  Administered 2018-04-28: 100 mg via INTRAVENOUS

## 2018-04-28 MED ORDER — MIDAZOLAM HCL 5 MG/5ML IJ SOLN
INTRAMUSCULAR | Status: DC | PRN
  Administered 2018-04-28: 2 mg via INTRAVENOUS

## 2018-04-28 MED ORDER — LIDOCAINE HCL (CARDIAC) PF 100 MG/5ML IV SOSY
PREFILLED_SYRINGE | INTRAVENOUS | Status: DC | PRN
  Administered 2018-04-28: 100 mg via INTRAVENOUS

## 2018-04-28 MED ORDER — ONDANSETRON HCL 4 MG/2ML IJ SOLN
4.0000 mg | Freq: Once | INTRAMUSCULAR | Status: DC | PRN
  Filled 2018-04-28: qty 2

## 2018-04-28 MED ORDER — KETOROLAC TROMETHAMINE 30 MG/ML IJ SOLN
INTRAMUSCULAR | Status: AC
  Filled 2018-04-28: qty 1

## 2018-04-28 MED ORDER — FENTANYL CITRATE (PF) 100 MCG/2ML IJ SOLN
25.0000 ug | INTRAMUSCULAR | Status: DC | PRN
  Filled 2018-04-28: qty 1

## 2018-04-28 SURGICAL SUPPLY — 43 items
BAG URINE DRAINAGE (UROLOGICAL SUPPLIES) ×5 IMPLANT
BLADE CLIPPER SURG (BLADE) ×5 IMPLANT
CATH FOLEY 2WAY SLVR  5CC 16FR (CATHETERS) ×4
CATH FOLEY 2WAY SLVR 5CC 16FR (CATHETERS) ×6 IMPLANT
CATH ROBINSON RED A/P 20FR (CATHETERS) ×5 IMPLANT
CLOTH BEACON ORANGE TIMEOUT ST (SAFETY) ×5 IMPLANT
CONT SPECI 4OZ STER CLIK (MISCELLANEOUS) ×10 IMPLANT
COVER BACK TABLE 60X90IN (DRAPES) ×5 IMPLANT
COVER MAYO STAND STRL (DRAPES) ×5 IMPLANT
DRSG TEGADERM 4X4.75 (GAUZE/BANDAGES/DRESSINGS) ×10 IMPLANT
DRSG TEGADERM 8X12 (GAUZE/BANDAGES/DRESSINGS) ×10 IMPLANT
GAUZE SPONGE 4X4 12PLY STRL (GAUZE/BANDAGES/DRESSINGS) ×3 IMPLANT
GLOVE BIO SURGEON STRL SZ 6 (GLOVE) IMPLANT
GLOVE BIO SURGEON STRL SZ 6.5 (GLOVE) IMPLANT
GLOVE BIO SURGEON STRL SZ7 (GLOVE) IMPLANT
GLOVE BIO SURGEON STRL SZ8 (GLOVE) ×5 IMPLANT
GLOVE BIO SURGEONS STRL SZ 6.5 (GLOVE)
GLOVE BIOGEL PI IND STRL 6 (GLOVE) IMPLANT
GLOVE BIOGEL PI IND STRL 6.5 (GLOVE) IMPLANT
GLOVE BIOGEL PI IND STRL 8 (GLOVE) IMPLANT
GLOVE BIOGEL PI INDICATOR 6 (GLOVE)
GLOVE BIOGEL PI INDICATOR 6.5 (GLOVE)
GLOVE BIOGEL PI INDICATOR 8 (GLOVE) ×4
GLOVE ECLIPSE 8.0 STRL XLNG CF (GLOVE) ×17 IMPLANT
GLOVE INDICATOR 7.0 STRL GRN (GLOVE) IMPLANT
GOWN STRL REUS W/TWL XL LVL3 (GOWN DISPOSABLE) ×5 IMPLANT
HOLDER FOLEY CATH W/STRAP (MISCELLANEOUS) ×5 IMPLANT
I-Seed AgX100 ×312 IMPLANT
IMPL SPACEOAR SYSTEM 10ML (Spacer) ×1 IMPLANT
IMPLANT SPACEOAR SYSTEM 10ML (Spacer) ×5 IMPLANT
IV NS 1000ML (IV SOLUTION) ×5
IV NS 1000ML BAXH (IV SOLUTION) ×3 IMPLANT
KIT TURNOVER CYSTO (KITS) ×5 IMPLANT
MANIFOLD NEPTUNE II (INSTRUMENTS) IMPLANT
MARKER SKIN DUAL TIP RULER LAB (MISCELLANEOUS) ×5 IMPLANT
PACK CYSTO (CUSTOM PROCEDURE TRAY) ×5 IMPLANT
SURGILUBE 2OZ TUBE FLIPTOP (MISCELLANEOUS) ×5 IMPLANT
SUT BONE WAX W31G (SUTURE) IMPLANT
SYR 10ML LL (SYRINGE) ×10 IMPLANT
TOWEL OR 17X24 6PK STRL BLUE (TOWEL DISPOSABLE) ×10 IMPLANT
UNDERPAD 30X30 (UNDERPADS AND DIAPERS) ×10 IMPLANT
WATER STERILE IRR 1000ML POUR (IV SOLUTION) ×3 IMPLANT
WATER STERILE IRR 3000ML UROMA (IV SOLUTION) ×3 IMPLANT

## 2018-04-28 NOTE — Anesthesia Preprocedure Evaluation (Signed)
Anesthesia Evaluation  Patient identified by MRN, date of birth, ID band Patient awake    Reviewed: Allergy & Precautions, NPO status   Airway Mallampati: I       Dental no notable dental hx. (+) Teeth Intact   Pulmonary Current Smoker,    Pulmonary exam normal breath sounds clear to auscultation       Cardiovascular negative cardio ROS Normal cardiovascular exam Rhythm:Regular Rate:Normal     Neuro/Psych PSYCHIATRIC DISORDERS Anxiety negative neurological ROS     GI/Hepatic negative GI ROS,   Endo/Other  negative endocrine ROS  Renal/GU negative Renal ROS     Musculoskeletal   Abdominal Normal abdominal exam  (+)   Peds  Hematology negative hematology ROS (+)   Anesthesia Other Findings   Reproductive/Obstetrics                             Anesthesia Physical Anesthesia Plan  ASA: II  Anesthesia Plan: General   Post-op Pain Management:    Induction: Intravenous  PONV Risk Score and Plan: 2 and Ondansetron and Dexamethasone  Airway Management Planned: LMA  Additional Equipment:   Intra-op Plan:   Post-operative Plan:   Informed Consent: I have reviewed the patients History and Physical, chart, labs and discussed the procedure including the risks, benefits and alternatives for the proposed anesthesia with the patient or authorized representative who has indicated his/her understanding and acceptance.   Dental advisory given  Plan Discussed with: CRNA and Surgeon  Anesthesia Plan Comments:         Anesthesia Quick Evaluation

## 2018-04-28 NOTE — Anesthesia Procedure Notes (Signed)
Procedure Name: LMA Insertion Date/Time: 04/28/2018 11:47 AM Performed by: Justice Rocher, CRNA Pre-anesthesia Checklist: Patient identified, Emergency Drugs available, Suction available and Patient being monitored Patient Re-evaluated:Patient Re-evaluated prior to induction Oxygen Delivery Method: Circle system utilized Preoxygenation: Pre-oxygenation with 100% oxygen Induction Type: IV induction Ventilation: Mask ventilation without difficulty LMA: LMA inserted LMA Size: 4.0 Number of attempts: 1 Airway Equipment and Method: Bite block Placement Confirmation: positive ETCO2 and breath sounds checked- equal and bilateral Tube secured with: Tape Dental Injury: Teeth and Oropharynx as per pre-operative assessment

## 2018-04-28 NOTE — Discharge Instructions (Signed)
Indwelling Urinary Catheter Care, Adult °Take good care of your catheter to keep it working and to prevent problems. °How to wear your catheter °Attach your catheter to your leg with tape (adhesive tape) or a leg strap. Make sure it is not too tight. If you use tape, remove any bits of tape that are already on the catheter. °How to wear a drainage bag °You should have: °· A large overnight bag. °· A small leg bag. ° °Overnight Bag °You may wear the overnight bag at any time. Always keep the bag below the level of your bladder but off the floor. When you sleep, put a clean plastic bag in a wastebasket. Then hang the bag inside the wastebasket. °Leg Bag °Never wear the leg bag at night. Always wear the leg bag below your knee. Keep the leg bag secure with a leg strap or tape. °How to care for your skin °· Clean the skin around the catheter at least once every day. °· Shower every day. Do not take baths. °· Put creams, lotions, or ointments on your genital area only as told by your doctor. °· Do not use powders, sprays, or lotions on your genital area. °How to clean your catheter and your skin °1. Wash your hands with soap and water. °2. Wet a washcloth in warm water and gentle (mild) soap. °3. Use the washcloth to clean the skin where the catheter enters your body. Clean downward and wipe away from the catheter in small circles. Do not wipe toward the catheter. °4. Pat the area dry with a clean towel. Make sure to clean off all soap. °How to care for your drainage bags °Empty your drainage bag when it is ?-½ full or at least 2-3 times a day. Replace your drainage bag once a month or sooner if it starts to smell bad or look dirty. Do not clean your drainage bag unless told by your doctor. °Emptying a drainage bag ° °Supplies Needed °· Rubbing alcohol. °· Gauze pad or cotton ball. °· Tape or a leg strap. ° °Steps °1. Wash your hands with soap and water. °2. Separate (detach) the bag from your leg. °3. Hold the bag over  the toilet or a clean container. Keep the bag below your hips and bladder. This stops pee (urine) from going back into the tube. °4. Open the pour spout at the bottom of the bag. °5. Empty the pee into the toilet or container. Do not let the pour spout touch any surface. °6. Put rubbing alcohol on a gauze pad or cotton ball. °7. Use the gauze pad or cotton ball to clean the pour spout. °8. Close the pour spout. °9. Attach the bag to your leg with tape or a leg strap. °10. Wash your hands. ° °Changing a drainage bag °Supplies Needed °· Alcohol wipes. °· A clean drainage bag. °· Adhesive tape or a leg strap. ° °Steps °1. Wash your hands with soap and water. °2. Separate the dirty bag from your leg. °3. Pinch the rubber catheter with your fingers so that pee does not spill out. °4. Separate the catheter tube from the drainage tube where these tubes connect (at the connection valve). Do not let the tubes touch any surface. °5. Clean the end of the catheter tube with an alcohol wipe. Use a different alcohol wipe to clean the end of the drainage tube. °6. Connect the catheter tube to the drainage tube of the clean bag. °7. Attach the new bag to   the leg with adhesive tape or a leg strap. °8. Wash your hands. ° °How to prevent infection and other problems °· Never pull on your catheter or try to remove it. Pulling can damage tissue in your body. °· Always wash your hands before and after touching your catheter. °· If a leg strap gets wet, replace it with a dry one. °· Drink enough fluids to keep your pee clear or pale yellow, or as told by your doctor. °· Do not let the drainage bag or tubing touch the floor. °· Wear cotton underwear. °· If you are male, wipe from front to back after you poop (have a bowel movement). °· Check on the catheter often to make sure it works and the tubing is not twisted. °Get help if: °· Your pee is cloudy. °· Your pee smells unusually bad. °· Your pee is not draining into the bag. °· Your  tube gets clogged. °· Your catheter starts to leak. °· Your bladder feels full. °Get help right away if: °· You have redness, swelling, or pain where the catheter enters your body. °· You have fluid, pus, or a bad smell coming from the area where the catheter enters your body. °· The area where the catheter enters your body feels warm. °· You have a fever. °· You have pain in your: °? Stomach (abdomen). °? Legs. °? Lower back. °? Bladder. °· You see blood fill the catheter. °· Your pee is pink or red. °· You feel sick to your stomach (nauseous). °· You throw up (vomit). °· You have chills. °· Your catheter gets pulled out. °This information is not intended to replace advice given to you by your health care provider. Make sure you discuss any questions you have with your health care provider. °Document Released: 10/23/2012 Document Revised: 05/26/2016 Document Reviewed: 12/11/2013 °Elsevier Interactive Patient Education © 2018 [employer] ° ° ° ° °Post Anesthesia Home Care Instructions ° °Activity: °Get plenty of rest for the remainder of the day. A responsible individual must stay with you for 24 hours following the procedure.  °For the next 24 hours, DO NOT: °-Drive a car °-Operate machinery °-Drink alcoholic beverages °-Take any medication unless instructed by your physician °-Make any legal decisions or sign important papers. ° °Meals: °Start with liquid foods such as gelatin or soup. Progress to regular foods as tolerated. Avoid greasy, spicy, heavy foods. If nausea and/or vomiting occur, drink only clear liquids until the nausea and/or vomiting subsides. Call your physician if vomiting continues. ° °Special Instructions/Symptoms: °Your throat may feel dry or sore from the anesthesia or the breathing tube placed in your throat during surgery. If this causes discomfort, gargle with warm salt water. The discomfort should disappear within 24 hours. ° °If you had a scopolamine patch placed behind your ear for the  management of post- operative nausea and/or vomiting: ° °1. The medication in the patch is effective for 72 hours, after which it should be removed.  Wrap patch in a tissue and discard in the trash. Wash hands thoroughly with soap and water. °2. You may remove the patch earlier than 72 hours if you experience unpleasant side effects which may include dry mouth, dizziness or visual disturbances. °3. Avoid touching the patch. Wash your hands with soap and water after contact with the patch. °  ° °

## 2018-04-28 NOTE — Op Note (Signed)
PRE-OPERATIVE DIAGNOSIS:  Adenocarcinoma of the prostate  POST-OPERATIVE DIAGNOSIS:  Same  PROCEDURE:  Procedure(s): 1. I-125 radioactive seed implantation 2. Cystoscopy 3. Placement of SpaceOAR  SURGEON:  Surgeon(s): Patrick Mckenzie, MD  Radiation oncologist: Matthew Manning, MD  ANESTHESIA:  General  EBL:  Minimal  DRAINS: 16 French Foley catheter  INDICATION: David White is a 67 year old with a history of T1c prostate cancer. After discussing treatment options he has elected to proceed with brachytherapy and SpaceOAR.  Description of procedure: After informed consent the patient was brought to the major OR, placed on the table and administered general anesthesia. He was then moved to the modified lithotomy position with his perineum perpendicular to the floor. His perineum and genitalia were then sterilely prepped. An official timeout was then performed. A 16 French Foley catheter was then placed in the bladder and filled with dilute contrast, a rectal tube was placed in the rectum and the transrectal ultrasound probe was placed in the rectum and affixed to the stand. He was then sterilely draped.  Real time ultrasonography was used along with the seed planning software [employer] Prostate vs. 4.2.21. This was used to develop the seed plan including the number of needles as well as number of seeds required for complete and adequate coverage. Real-time ultrasonography was then used along with the previously developed plan and the Nucletron device to implant a total of 78 seeds using 28 needles. This proceeded without difficulty or complication.  We then proceeded to mix the SpaceOAR using the kit supplied from the manufacturer. Once this was complete we placed a sinal needle into the perirectal fat between the rectum and the prostate. Once this was accomplished we injected 2cc of normal saline to hydrodissect the plain. We then instilled the the SpaceOAR through the spinal needle and  noted good distribution in the perirectal fat.   A Foley catheter was then removed as well as the transrectal ultrasound probe and rectal probe. Flexible cystoscopy was then performed using the 17 French flexible scope which revealed a normal urethra throughout its length down to the sphincter which appeared intact. The prostatic urethra revealed bilobar hypertrophy but no evidence of obstruction, seeds, spacers or lesions. The bladder was then entered and fully and systematically inspected. The ureteral orifices were noted to be of normal configuration and position. The mucosa revealed no evidence of tumors. There were also no stones identified within the bladder. I noted no seeds or spacers on the floor of the bladder and retroflexion of the scope revealed no seeds protruding from the base of the prostate.  The cystoscope was then removed and a new 16 French Foley catheter was then inserted and the balloon was filled with 10 cc of sterile water. This was connected to closed system drainage and the patient was awakened and taken to recovery room in stable and satisfactory condition. He tolerated procedure well and there were no intraoperative complications.   

## 2018-04-28 NOTE — Transfer of Care (Signed)
Immediate Anesthesia Transfer of Care Note  Patient: David White  Procedure(s) Performed: Procedure(s) (LRB): RADIOACTIVE SEED IMPLANT/BRACHYTHERAPY IMPLANT (N/A) SPACE OAR INSTILLATION (N/A) CYSTOSCOPY FLEXIBLE  Patient Location: PACU  Anesthesia Type: General  Level of Consciousness: awake, sedated, patient cooperative and responds to stimulation  Airway & Oxygen Therapy: Patient Spontanous Breathing and Patient connected to [place] oxygen  Post-op Assessment: Report given to PACU RN, Post -op Vital signs reviewed and stable and Patient moving all extremities  Post vital signs: Reviewed and stable  Complications: No apparent anesthesia complications

## 2018-04-28 NOTE — H&P (Signed)
Urology Admission H&P  Chief Complaint: prostate cancer  History of Present Illness: [name] is a 67yo with a hx of T1c prostate cancer here for brachytherapy. No significant LUTS currently. No nausea/vomiting. No fevers/chills/sweats  Past Medical History:  Diagnosis Date  . Anxiety   . Arthritis   . Hepatitis    hepatitis a as child  . Hypercholesteremia   . Macular degeneration    left eye  . Prostate cancer ([place])   . Tobacco abuse    Past Surgical History:  Procedure Laterality Date  . APPENDECTOMY    . EYE SURGERY  10-15 yrs ago   ioc for cataracts  . PROSTATE BIOPSY N/A 01/09/2018   Procedure: BIOPSY TRANSRECTAL ULTRASONIC PROSTATE (TUBP);  Surgeon: Cleon Gustin, MD;  Location: [facility];  Service: Urology;  Laterality: N/A;  30 MINS  . removal of cyst     back of head    Home Medications:  Current Facility-Administered Medications  Medication Dose Route Frequency Provider Last Rate Last Dose  . lactated ringers infusion   Intravenous Continuous Lyn Hollingshead, MD 50 mL/hr at 04/28/18 1009    . [START ON 04/29/2018] sodium phosphate (FLEET) 7-19 GM/118ML enema 1 enema  1 enema Rectal Once Coretta Leisey, Candee Furbish, MD       Allergies:  Allergies  Allergen Reactions  . Codeine Other (See Comments)    Headache dizziness    Family History  Problem Relation Age of Onset  . Lung cancer Father   . Prostate cancer Neg Hx   . Colon cancer Neg Hx   . Pancreatic cancer Neg Hx   . Breast cancer Neg Hx    Social History:  reports that he has been smoking cigarettes. He has a 60.00 pack-year smoking history. He has never used smokeless tobacco. He reports that he drinks alcohol. He reports that he does not use drugs.  Review of Systems  All other systems reviewed and are negative.   Physical Exam:  Vital signs in last 24 hours: Temp:  [97.7 F (36.5 C)] 97.7 F (36.5 C) (10/18 0937) Pulse Rate:  [89] 89 (10/18 0937) Resp:  [16] 16 (10/18 0937) BP: (139)/(73)  139/73 (10/18 0937) SpO2:  [98 %] 98 % (10/18 0937) Weight:  [73.9 kg] 73.9 kg (10/18 0937) Physical Exam  Constitutional: He is oriented to person, place, and time. He appears well-developed and well-nourished.  HENT:  Head: Normocephalic and atraumatic.  Eyes: Pupils are equal, round, and reactive to light. EOM are normal.  Neck: Normal range of motion. No thyromegaly present.  Cardiovascular: Normal rate and regular rhythm.  Respiratory: Effort normal. No respiratory distress.  GI: Soft. He exhibits no distension.  Musculoskeletal: Normal range of motion. He exhibits no edema.  Neurological: He is alert and oriented to person, place, and time.  Skin: Skin is warm and dry.  Psychiatric: He has a normal mood and affect. His behavior is normal. Judgment and thought content normal.    Laboratory Data:  No results found for this or any previous visit (from the past 24 hour(s)). No results found for this or any previous visit (from the past 240 hour(s)). Creatinine: No results for input(s): CREATININE in the last 168 hours. Baseline Creatinine: unknown  Impression/Assessment:  67yo with T1c prostate cancer  Plan:  The risks/benefits/alternatives to brachytherapy and SpaceOAR placement was explained to the patient and he understands and wishes to proceed with surgery  Nicolette Bang 04/28/2018, 11:26 AM

## 2018-04-28 NOTE — Progress Notes (Signed)
  Radiation Oncology         [phone] ________________________________  Name: ELMOND POEHLMAN MRN: [id number]  Date: 04/28/2018  DOB: 08-09-1950       Prostate Seed Implant  OZ:HYQMV, Purcell Nails, MD  No ref. provider found  DIAGNOSIS: 67 y.o. gentleman with Stage T1c adenocarcinoma of the prostate with Gleason Score of 3+4, and PSA of 20.2 PROCEDURE: Insertion of radioactive I-125 seeds into the prostate gland.  RADIATION DOSE: 145 Gy, definitive therapy.  TECHNIQUE: SOSAIA PITTINGER was brought to the operating room with the urologist. He was placed in the dorsolithotomy position. He was catheterized and a rectal tube was inserted. The perineum was shaved, prepped and draped. The ultrasound probe was then introduced into the rectum to see the prostate gland.  TREATMENT DEVICE: A needle grid was attached to the ultrasound probe stand and anchor needles were placed.  3D PLANNING: The prostate was imaged in 3D using a sagittal sweep of the prostate probe. These images were transferred to the planning computer. There, the prostate, urethra and rectum were defined on each axial reconstructed image. Then, the software created an optimized 3D plan and a few seed positions were adjusted. The quality of the plan was reviewed using [facility] information for the target and the following two organs at risk:  Urethra and Rectum.  Then the accepted plan was printed and handed off to the radiation therapist.  Under my supervision, the custom loading of the seeds and spacers was carried out and loaded into sealed vicryl sleeves.  These pre-loaded needles were then placed into the needle holder.Marland Kitchen  PROSTATE VOLUME STUDY:  Using transrectal ultrasound the volume of the prostate was verified to be 37.3 cc.  SPECIAL TREATMENT PROCEDURE/SUPERVISION AND HANDLING: The pre-loaded needles were then delivered under sagittal guidance. A total of 28 needles were used to deposit 78 seeds in the prostate gland. The individual seed  activity was 0.379 mCi.  SpaceOAR:  Yes  COMPLEX SIMULATION: At the end of the procedure, an anterior radiograph of the pelvis was obtained to document seed positioning and count. Cystoscopy was performed to check the urethra and bladder.  MICRODOSIMETRY: At the end of the procedure, the patient was emitting 0.22 mR/hr at 1 meter. Accordingly, he was considered safe for hospital discharge.  PLAN: The patient will return to the radiation oncology clinic for post implant CT dosimetry in three weeks.   ________________________________  Sheral Apley Tammi Klippel, M.D.

## 2018-05-01 ENCOUNTER — Encounter (HOSPITAL_BASED_OUTPATIENT_CLINIC_OR_DEPARTMENT_OTHER): Payer: Self-pay | Admitting: Urology

## 2018-05-03 ENCOUNTER — Ambulatory Visit (INDEPENDENT_AMBULATORY_CARE_PROVIDER_SITE_OTHER): Payer: PPO | Admitting: Urology

## 2018-05-03 DIAGNOSIS — C61 Malignant neoplasm of prostate: Secondary | ICD-10-CM | POA: Diagnosis not present

## 2018-05-03 NOTE — Anesthesia Postprocedure Evaluation (Signed)
Anesthesia Post Note  Patient: David White  Procedure(s) Performed: RADIOACTIVE SEED IMPLANT/BRACHYTHERAPY IMPLANT (N/A Prostate) SPACE OAR INSTILLATION (N/A Rectum) CYSTOSCOPY FLEXIBLE (Bladder)     Patient location during evaluation: PACU Anesthesia Type: General Level of consciousness: awake Pain management: pain level controlled Vital Signs Assessment: post-procedure vital signs reviewed and stable Respiratory status: spontaneous breathing Cardiovascular status: stable Postop Assessment: no apparent nausea or vomiting Anesthetic complications: no    Last Vitals:  Vitals:   04/28/18 1422 04/28/18 1600  BP:  130/66  Pulse:  86  Resp:  14  Temp:  36.5 C  SpO2: 92% 95%    Last Pain:  Vitals:   04/28/18 1615  TempSrc:   PainSc: 2    Pain Goal: Patients Stated Pain Goal: 5 (04/28/18 1600)               Huston Foley

## 2018-05-08 DIAGNOSIS — Z6823 Body mass index (BMI) 23.0-23.9, adult: Secondary | ICD-10-CM | POA: Diagnosis not present

## 2018-05-08 DIAGNOSIS — Z1389 Encounter for screening for other disorder: Secondary | ICD-10-CM | POA: Diagnosis not present

## 2018-05-08 DIAGNOSIS — N342 Other urethritis: Secondary | ICD-10-CM | POA: Diagnosis not present

## 2018-05-11 ENCOUNTER — Telehealth: Payer: Self-pay | Admitting: *Deleted

## 2018-05-11 NOTE — Telephone Encounter (Signed)
Called patient to remind of appts. for 05-12-18, spoke with patient and he is aware of these appts.

## 2018-05-12 ENCOUNTER — Encounter: Payer: Self-pay | Admitting: Radiation Oncology

## 2018-05-12 ENCOUNTER — Ambulatory Visit
Admission: RE | Admit: 2018-05-12 | Discharge: 2018-05-12 | Disposition: A | Discharge status: Home / Self Care | Payer: PPO | Source: Ambulatory Visit | Attending: Radiation Oncology | Admitting: Radiation Oncology

## 2018-05-12 ENCOUNTER — Other Ambulatory Visit: Payer: Self-pay

## 2018-05-12 ENCOUNTER — Ambulatory Visit (HOSPITAL_COMMUNITY)
Admission: RE | Admit: 2018-05-12 | Discharge: 2018-05-12 | Disposition: A | Discharge status: Home / Self Care | Payer: PPO | Source: Ambulatory Visit | Attending: Urology | Admitting: Urology

## 2018-05-12 VITALS — BP 133/74 | HR 90 | Temp 98.1°F | Resp 18 | Wt 162.6 lb

## 2018-05-12 DIAGNOSIS — Z885 Allergy status to narcotic agent status: Secondary | ICD-10-CM

## 2018-05-12 DIAGNOSIS — Z923 Personal history of irradiation: Secondary | ICD-10-CM

## 2018-05-12 DIAGNOSIS — C61 Malignant neoplasm of prostate: Secondary | ICD-10-CM

## 2018-05-12 DIAGNOSIS — Z79899 Other long term (current) drug therapy: Secondary | ICD-10-CM | POA: Insufficient documentation

## 2018-05-12 NOTE — Progress Notes (Signed)
  Radiation Oncology         [phone] ________________________________  Name: David White MRN: [id number]  Date: 05/12/2018  DOB: Dec 05, 1950  COMPLEX SIMULATION NOTE  NARRATIVE:  The patient was brought to the [facility] today following prostate seed implantation approximately one month ago.  Identity was confirmed.  All relevant records and images related to the planned course of therapy were reviewed.  Then, the patient was set-up supine.  CT images were obtained.  The CT images were loaded into the planning software.  Then the prostate and rectum were contoured.  Treatment planning then occurred.  The implanted iodine 125 seeds were identified by the physics staff for projection of radiation distribution  I have requested : 3D Simulation  I have requested a DVH of the following structures: Prostate and rectum.    ________________________________  Sheral Apley Tammi Klippel, M.D.  This document serves as a record of services personally performed by [name] Pita, MD. It was created on his behalf by Wilburn Mylar, a trained medical scribe. The creation of this record is based on the scribe's personal observations and the provider's statements to them. This document has been checked and approved by the attending provider.

## 2018-05-12 NOTE — Progress Notes (Signed)
Pt presents today for post seed appointment with Dr. Tammi Klippel. Pt reports mild fatigue. Pt denies c/o pain. Pt reports UTI and is on Cipro. Pt's MRI is scheduled for immediately following this appt. Pt's urology f/u is scheduled for 05/17/18, but pt states he may have to reschedule due to possible work out of town for "a few weeks".   BP 133/74 (BP Location: Left Arm, Patient Position: Sitting)   Pulse 90   Temp 98.1 F (36.7 C) (Oral)   Resp 18   Wt 162 lb 9.6 oz (73.8 kg)   SpO2 97%   BMI 24.01 kg/m   Wt Readings from Last 3 Encounters:  05/12/18 162 lb 9.6 oz (73.8 kg)  04/28/18 163 lb (73.9 kg)  02/16/18 163 lb 3.2 oz (74 kg)   Loma Sousa, RN BSN

## 2018-05-12 NOTE — Progress Notes (Signed)
Radiation Oncology         [phone] ________________________________  Name: David White MRN: [id number]  Date: 05/12/2018  DOB: 1950/12/09  Follow-Up Visit Note  CC: Redmond School, MD  Cleon Gustin, MD  DIAGNOSIS: 67 y.o. gentleman with Stage T1c adenocarcinoma of the prostate with Gleason Score of 3+4, and PSA of 20.2     ICD-10-CM   1. Malignant neoplasm of prostate ([place]) C61     Interval Since Last Radiation:  2 weeks  Narrative:  The patient returns today for routine follow-up.  He is complaining of increased urinary frequency and urinary hesitation symptoms. He filled out a questionnaire regarding urinary function today providing and overall IPSS score of 4 characterizing his symptoms as mild.  His pre-implant score was 3. He denies any bowel symptoms.  ALLERGIES:  is allergic to codeine.  Meds: Current Outpatient Medications  Medication Sig Dispense Refill  . ALPRAZolam (XANAX) 0.5 MG tablet Take 1 tablet (0.5 mg total) by mouth 3 (three) times daily as needed for anxiety or sleep. 20 tablet 0  . atorvastatin (LIPITOR) 20 MG tablet Take 20 mg by mouth daily. On hold per pt  11  . CIPRODEX OTIC suspension PLACE 4 DROPS INTO RIGHT EAR BID FOR 14 DAYS  3  . ciprofloxacin (CIPRO) 500 MG tablet TK 1 T PO  BID  0  . Coenzyme Q10 (COQ10) 100 MG CAPS Take 1 capsule by mouth daily.    Marland Kitchen ibuprofen (ADVIL,MOTRIN) 800 MG tablet On hold    . meloxicam (MOBIC) 7.5 MG tablet Take 7.5 mg by mouth daily.    . tadalafil (CIALIS) 20 MG tablet Cialis 20 mg tablet    . tamsulosin (FLOMAX) 0.4 MG CAPS capsule TK 1 C PO QHS  1  . traMADol (ULTRAM) 50 MG tablet Take 1 tablet (50 mg total) by mouth every 6 (six) hours as needed for moderate pain. 30 tablet 0   Current Facility-Administered Medications  Medication Dose Route Frequency Provider Last Rate Last Dose  . ceFAZolin (ANCEF) IVPB 2g/100 mL premix  2 g Intravenous Q8H McKenzie, Candee Furbish, MD        Physical  Findings: The patient is in no acute distress. Patient is alert and oriented.  weight is 162 lb 9.6 oz (73.8 kg). His oral temperature is 98.1 F (36.7 C). His blood pressure is 133/74 and his pulse is 90. His respiration is 18 and oxygen saturation is 97%. .  No significant changes.  Lab Findings: Lab Results  Component Value Date   WBC 11.9 (H) 04/21/2018   HGB 14.6 04/21/2018   HCT 45.2 04/21/2018   MCV 97.2 04/21/2018   PLT 286 04/21/2018    Radiographic Findings:  Patient underwent CT imaging in our clinic for post implant dosimetry. The CT appears to demonstrate an adequate distribution of radioactive seeds throughout the prostate gland. There no seeds in her near the rectum. I suspect the final radiation plan and dosimetry will show appropriate coverage of the prostate gland.   Impression: The patient is recovering from the effects of radiation. His urinary symptoms should gradually improve over the next 4-6 months. We talked about this today. He is encouraged by his improvement already and is otherwise please with his outcome.   Plan: Today, I spent time talking to the patient about his prostate seed implant and resolving urinary symptoms. We also talked about long-term follow-up for prostate cancer following seed implant. He understands that ongoing PSA determinations and  digital rectal exams will help perform surveillance to rule out disease recurrence. He understands what to expect with his PSA measures. Patient was also educated today about some of the long-term effects from radiation including a small risk for rectal bleeding and possibly erectile dysfunction. We talked about some of the general management approaches to these potential complications. However, I did encourage the patient to contact our office or return at any point if he has questions or concerns related to his previous radiation and prostate cancer.  _____________________________________  Sheral Apley. Tammi Klippel,  M.D.

## 2018-05-15 NOTE — Addendum Note (Signed)
Encounter addended by: Heywood Footman, RN on: 05/15/2018 9:21 AM  Actions taken: Charge Capture section accepted

## 2018-05-29 ENCOUNTER — Encounter: Payer: Self-pay | Admitting: Radiation Oncology

## 2018-05-29 DIAGNOSIS — C61 Malignant neoplasm of prostate: Secondary | ICD-10-CM | POA: Diagnosis not present

## 2018-06-07 NOTE — Progress Notes (Signed)
  Radiation Oncology         [phone] ________________________________  Name: KIEREN ADKISON MRN: [id number]  Date: 05/29/2018  DOB: 03-Sep-1950  3D Planning Note   Prostate Brachytherapy Post-Implant Dosimetry  Diagnosis: 67 y.o. gentleman with Stage T1c adenocarcinoma of the prostate with Gleason Score of 3+4, and PSA of 20.2   Narrative: On a previous date, FARZAD TIBBETTS returned following prostate seed implantation for post implant planning. He underwent CT scan complex simulation to delineate the three-dimensional structures of the pelvis and demonstrate the radiation distribution.  Since that time, the seed localization, and complex isodose planning with dose volume histograms have now been completed.  Results:   Prostate Coverage - The dose of radiation delivered to the 90% or more of the prostate gland (D90) was 111.03% of the prescription dose. This exceeds our goal of greater than 90%. Rectal Sparing - The volume of rectal tissue receiving the prescription dose or higher was 0.0 cc. This falls under our thresholds tolerance of 1.0 cc.  Impression: The prostate seed implant appears to show adequate target coverage and appropriate rectal sparing.  Plan:  The patient will continue to follow with urology for ongoing PSA determinations. I would anticipate a high likelihood for local tumor control with minimal risk for rectal morbidity.  ________________________________  Sheral Apley Tammi Klippel, M.D.

## 2018-08-18 DIAGNOSIS — C61 Malignant neoplasm of prostate: Secondary | ICD-10-CM | POA: Diagnosis not present

## 2018-08-24 DIAGNOSIS — Z1389 Encounter for screening for other disorder: Secondary | ICD-10-CM | POA: Diagnosis not present

## 2018-08-24 DIAGNOSIS — Z6823 Body mass index (BMI) 23.0-23.9, adult: Secondary | ICD-10-CM | POA: Diagnosis not present

## 2018-08-24 DIAGNOSIS — C61 Malignant neoplasm of prostate: Secondary | ICD-10-CM | POA: Diagnosis not present

## 2018-08-24 DIAGNOSIS — F4320 Adjustment disorder, unspecified: Secondary | ICD-10-CM | POA: Diagnosis not present

## 2018-08-25 ENCOUNTER — Ambulatory Visit: Payer: PPO | Admitting: Urology

## 2018-08-25 DIAGNOSIS — N5201 Erectile dysfunction due to arterial insufficiency: Secondary | ICD-10-CM

## 2018-08-25 DIAGNOSIS — Z1389 Encounter for screening for other disorder: Secondary | ICD-10-CM | POA: Diagnosis not present

## 2018-08-25 DIAGNOSIS — C61 Malignant neoplasm of prostate: Secondary | ICD-10-CM | POA: Diagnosis not present

## 2018-08-25 DIAGNOSIS — Z6823 Body mass index (BMI) 23.0-23.9, adult: Secondary | ICD-10-CM | POA: Diagnosis not present

## 2018-08-25 DIAGNOSIS — Z0000 Encounter for general adult medical examination without abnormal findings: Secondary | ICD-10-CM | POA: Diagnosis not present

## 2018-11-16 DIAGNOSIS — C61 Malignant neoplasm of prostate: Secondary | ICD-10-CM | POA: Diagnosis not present

## 2019-01-26 DIAGNOSIS — M353 Polymyalgia rheumatica: Secondary | ICD-10-CM | POA: Diagnosis not present

## 2019-01-26 DIAGNOSIS — N401 Enlarged prostate with lower urinary tract symptoms: Secondary | ICD-10-CM | POA: Diagnosis not present

## 2019-01-26 DIAGNOSIS — Z0001 Encounter for general adult medical examination with abnormal findings: Secondary | ICD-10-CM | POA: Diagnosis not present

## 2019-01-26 DIAGNOSIS — I700 Atherosclerosis of aorta: Secondary | ICD-10-CM | POA: Diagnosis not present

## 2019-01-26 DIAGNOSIS — Z6822 Body mass index (BMI) 22.0-22.9, adult: Secondary | ICD-10-CM | POA: Diagnosis not present

## 2019-01-26 DIAGNOSIS — Z1389 Encounter for screening for other disorder: Secondary | ICD-10-CM | POA: Diagnosis not present

## 2019-02-02 ENCOUNTER — Ambulatory Visit: Payer: PPO | Admitting: Urology

## 2019-03-16 ENCOUNTER — Ambulatory Visit: Payer: PPO | Admitting: Urology

## 2019-03-16 ENCOUNTER — Other Ambulatory Visit: Payer: Self-pay

## 2019-03-16 DIAGNOSIS — C61 Malignant neoplasm of prostate: Secondary | ICD-10-CM

## 2019-03-16 DIAGNOSIS — N5201 Erectile dysfunction due to arterial insufficiency: Secondary | ICD-10-CM | POA: Diagnosis not present

## 2019-04-17 DIAGNOSIS — F419 Anxiety disorder, unspecified: Secondary | ICD-10-CM | POA: Diagnosis not present

## 2019-06-01 DIAGNOSIS — R079 Chest pain, unspecified: Secondary | ICD-10-CM | POA: Diagnosis not present

## 2019-06-01 DIAGNOSIS — F17200 Nicotine dependence, unspecified, uncomplicated: Secondary | ICD-10-CM | POA: Diagnosis not present

## 2019-06-01 DIAGNOSIS — Z8546 Personal history of malignant neoplasm of prostate: Secondary | ICD-10-CM | POA: Diagnosis not present

## 2019-06-01 DIAGNOSIS — Z885 Allergy status to narcotic agent status: Secondary | ICD-10-CM | POA: Diagnosis not present

## 2019-06-21 IMAGING — US US SCROTUM W/ DOPPLER COMPLETE
1 series · 13 of 25 positions shown · non-contrast
Comparison: None.

CLINICAL DATA: 66-year-old male with a three-month history of
scrotal swelling

EXAM:
SCROTAL ULTRASOUND
DOPPLER ULTRASOUND OF THE TESTICLES
TECHNIQUE: Complete ultrasound examination of the testicles, epididymis, and
other scrotal structures was performed. Color and spectral Doppler
ultrasound were also utilized to evaluate blood flow to the
testicles.

[Series 1: us scrotum w/ doppler complete · 13 of 53 slices shown]
[im 1/53]
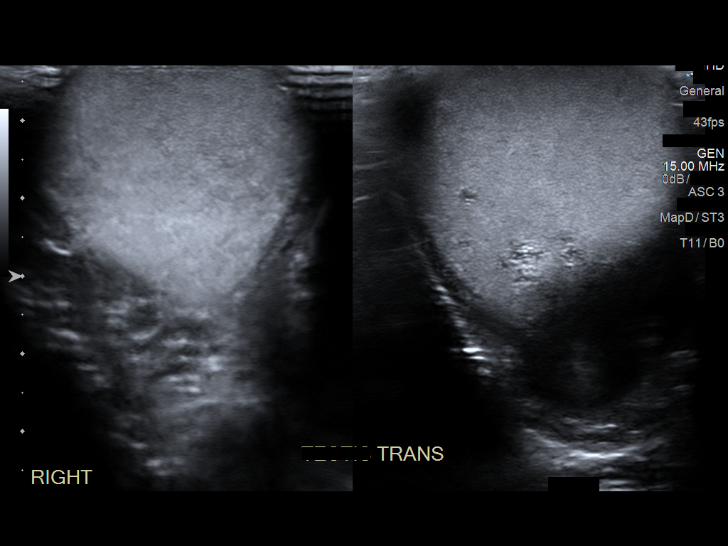
[im 5/53]
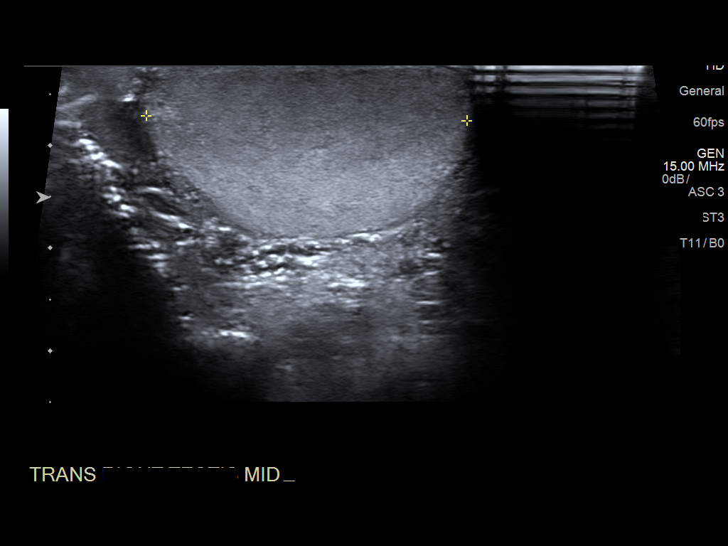
[im 9/53]
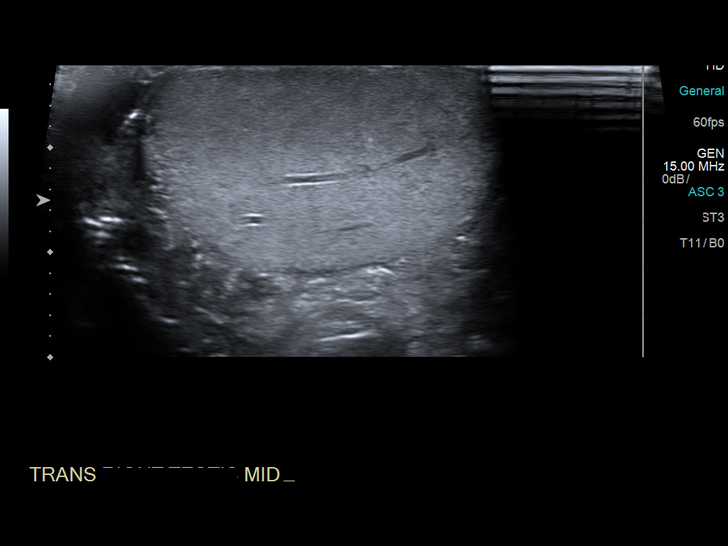
[im 14/53]
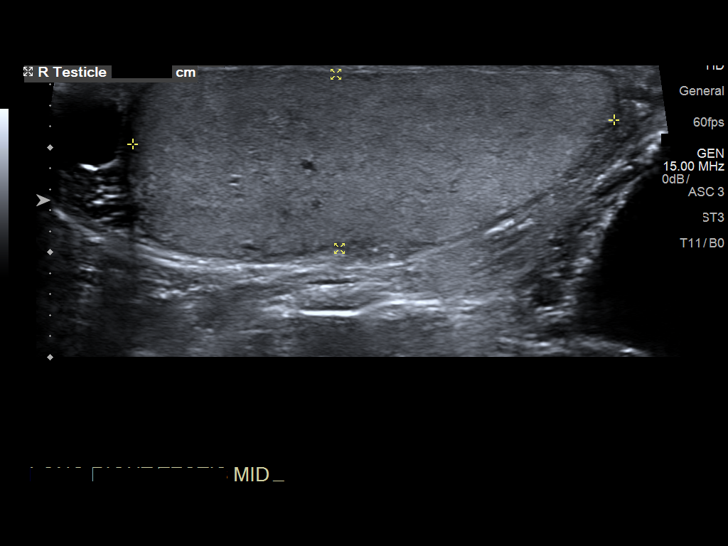
[im 18/53]
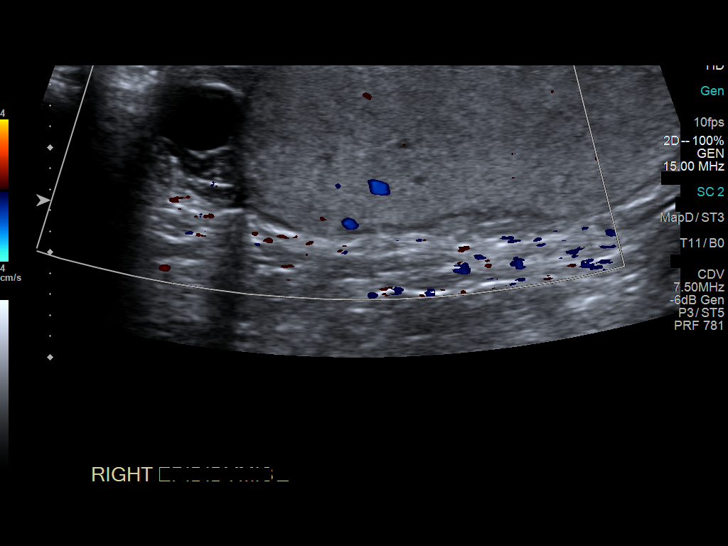
[im 22/53]
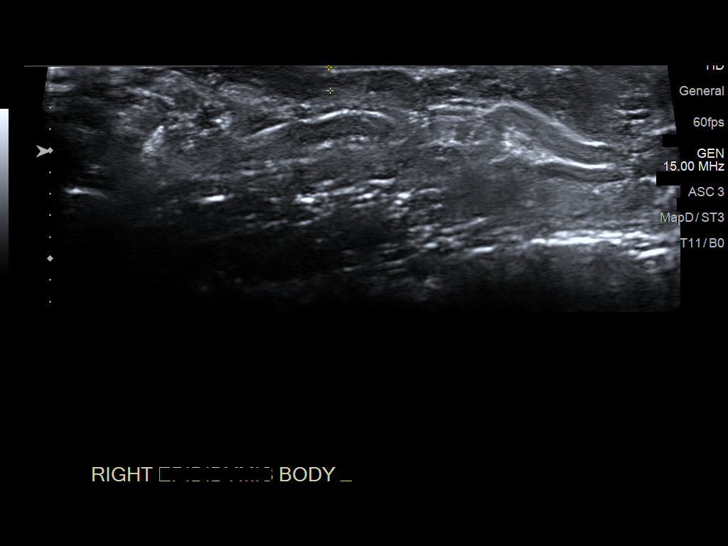
[im 27/53]
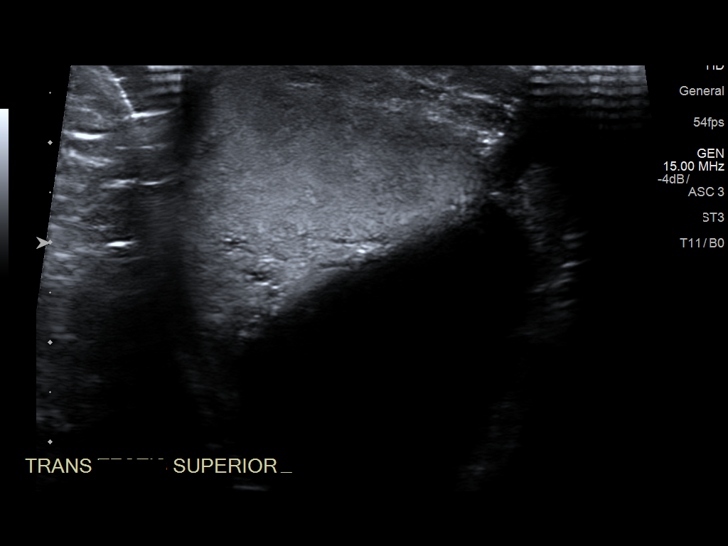
[im 31/53]
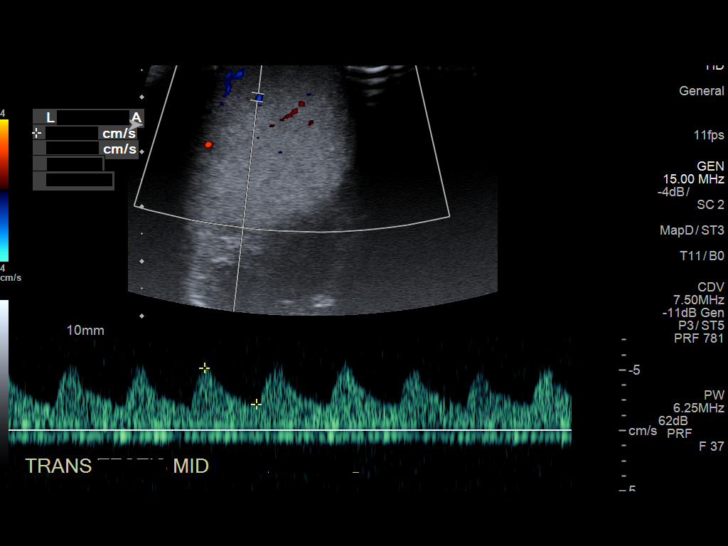
[im 35/53]
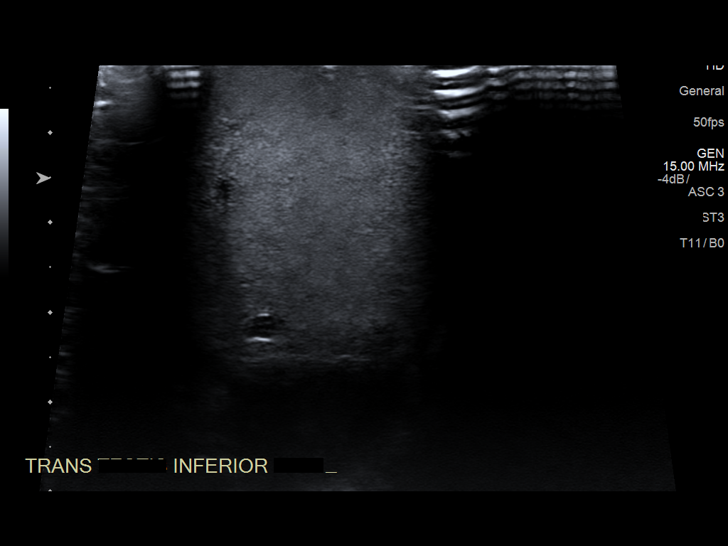
[im 40/53]
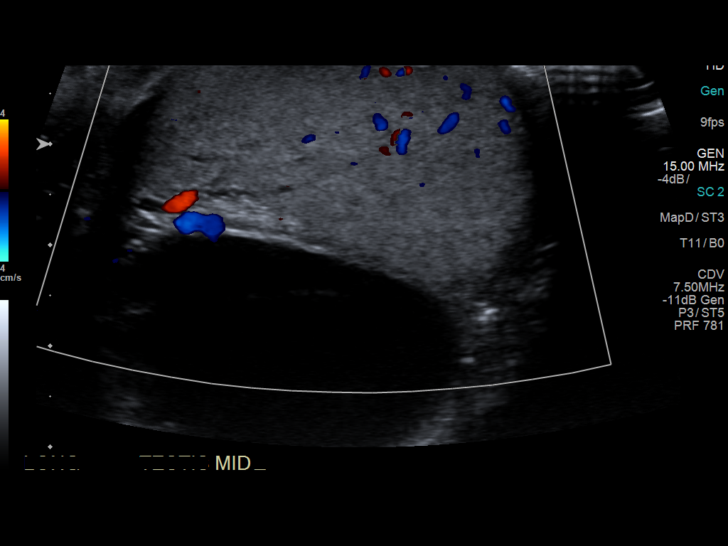
[im 44/53]
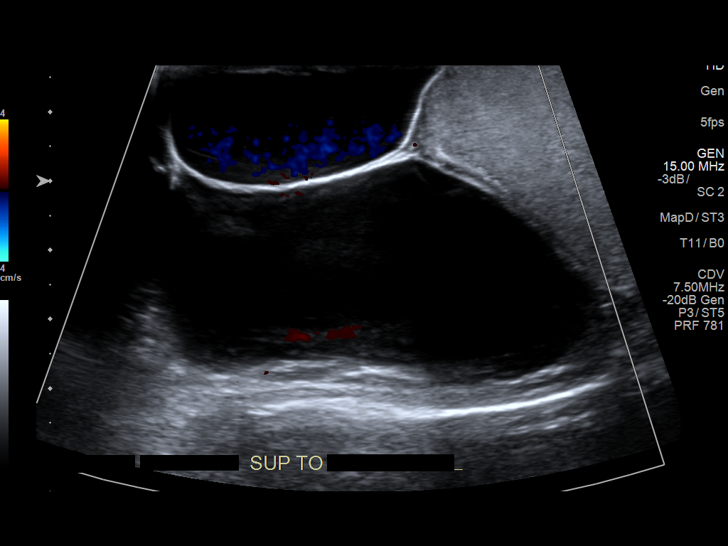
[im 48/53]
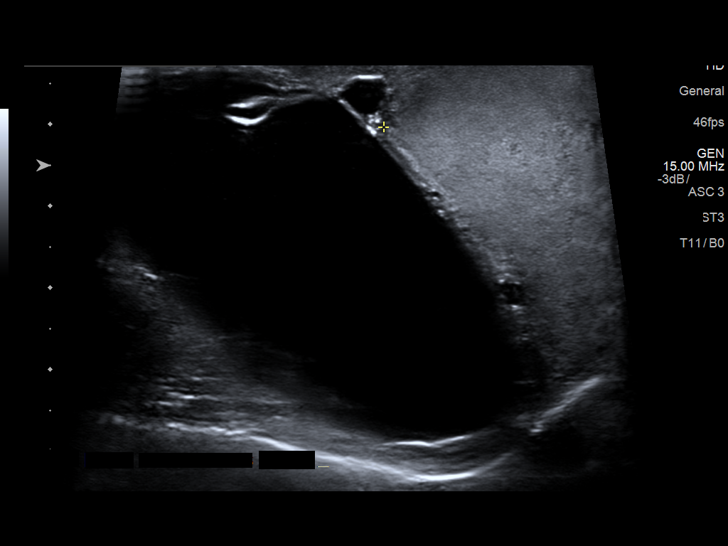
[im 53/53]
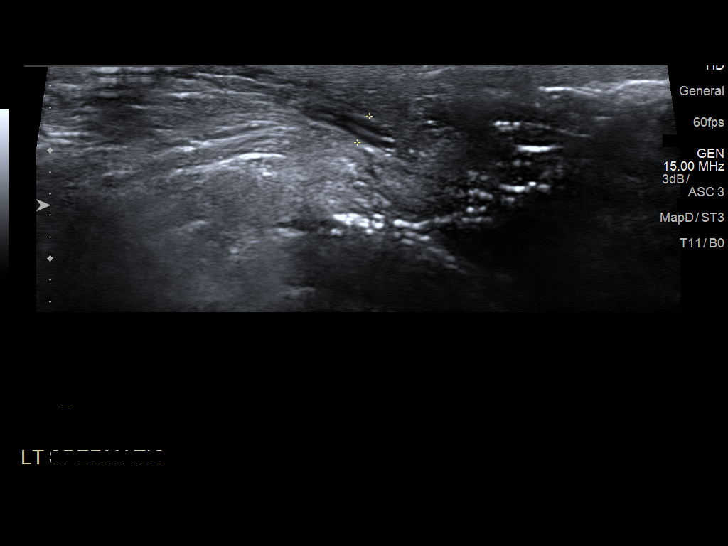

[13 of 25 positions shown; findings below may reference images not displayed]

FINDINGS: Right testicle

Measurements: 4.6 x 1.7 x 3.2 cm. No mass or microlithiasis
visualized.

Left testicle

Measurements: 3.9 x 1.9 x 2.6 cm. No mass or microlithiasis
visualized.

Right epididymis: Normal in size. Simple cyst in the epididymal head
measures 1.1 x 0.7 x 1.2 cm consistent with an epididymal cyst.

Left epididymis: The solid portion of the epididymal head is normal
in size. There is a large mildly septated but otherwise simple cyst
in the superior aspect of the scrotum which appears to be affiliated
with the epididymal head.

Hydrocele:  None visualized.

Varicocele:  None visualized.

Pulsed Doppler interrogation of both testes demonstrates normal low
resistance arterial and venous waveforms bilaterally.
IMPRESSION: 1. Large septated but otherwise simple cystic collection in the
superior aspect of the left scrotum may represent a mildly complex
and loculated hydrocele versus a large epididymal head cyst. A large
epididymal cyst is favored.
2. Small right simple epididymal head cyst.
3. No evidence of testicular torsion or mass.

## 2019-06-29 ENCOUNTER — Other Ambulatory Visit: Payer: Self-pay

## 2019-07-02 ENCOUNTER — Other Ambulatory Visit: Payer: Self-pay

## 2019-07-02 DIAGNOSIS — C61 Malignant neoplasm of prostate: Secondary | ICD-10-CM

## 2019-07-30 ENCOUNTER — Encounter: Payer: Self-pay | Admitting: Urology

## 2019-08-09 ENCOUNTER — Other Ambulatory Visit: Payer: Self-pay | Admitting: Urology

## 2019-08-09 DIAGNOSIS — C61 Malignant neoplasm of prostate: Secondary | ICD-10-CM | POA: Diagnosis not present

## 2019-08-10 ENCOUNTER — Other Ambulatory Visit: Payer: Self-pay | Admitting: Urology

## 2019-08-10 LAB — PSA: PSA: 0.2 ng/mL (ref <=4.0)

## 2019-08-23 DIAGNOSIS — H66012 Acute suppurative otitis media with spontaneous rupture of ear drum, left ear: Secondary | ICD-10-CM | POA: Diagnosis not present

## 2019-08-23 DIAGNOSIS — H7201 Central perforation of tympanic membrane, right ear: Secondary | ICD-10-CM | POA: Diagnosis not present

## 2019-08-23 DIAGNOSIS — J310 Chronic rhinitis: Secondary | ICD-10-CM | POA: Diagnosis not present

## 2019-08-23 DIAGNOSIS — J342 Deviated nasal septum: Secondary | ICD-10-CM | POA: Diagnosis not present

## 2019-10-03 ENCOUNTER — Telehealth (INDEPENDENT_AMBULATORY_CARE_PROVIDER_SITE_OTHER): Payer: PPO | Admitting: Urology

## 2019-10-03 ENCOUNTER — Other Ambulatory Visit: Payer: Self-pay

## 2019-10-03 DIAGNOSIS — N5201 Erectile dysfunction due to arterial insufficiency: Secondary | ICD-10-CM

## 2019-10-03 DIAGNOSIS — C61 Malignant neoplasm of prostate: Secondary | ICD-10-CM | POA: Diagnosis not present

## 2019-10-03 MED ORDER — TADALAFIL 20 MG PO TABS: 20.0000 mg | ORAL_TABLET | ORAL | 5 refills | Status: DC | PRN

## 2019-10-03 NOTE — Progress Notes (Signed)
10/03/2019 12:48 PM   Arlyss Repress 1951/05/08 [id number]  Referring provider: Redmond School, MD [address] [place],  [place] [address]  Prostate cancer and erectile dysfunction  HPI: [name] is a 69yo whpo has a followup for prostate cancer and ED today via teleconference. His PSA is 0.2. No significant LUTS and no bone pain. He has stable ED and gets a semifirm erection with tadalafil 5mg  prn. Good libido.    PMH: Past Medical History:  Diagnosis Date  . Anxiety   . Arthritis   . Hepatitis    hepatitis a as child  . Hypercholesteremia   . Macular degeneration    left eye  . Prostate cancer ([place])   . Tobacco abuse     Surgical History: Past Surgical History:  Procedure Laterality Date  . APPENDECTOMY    . CYSTOSCOPY  04/28/2018   Procedure: CYSTOSCOPY FLEXIBLE;  Surgeon: Cleon Gustin, MD;  Location: [facility];  Service: Urology;;  no seeds found in bladder  . EYE SURGERY  10-15 yrs ago   ioc for cataracts  . PROSTATE BIOPSY N/A 01/09/2018   Procedure: BIOPSY TRANSRECTAL ULTRASONIC PROSTATE (TUBP);  Surgeon: Cleon Gustin, MD;  Location: [facility];  Service: Urology;  Laterality: N/A;  30 MINS  . RADIOACTIVE SEED IMPLANT N/A 04/28/2018   Procedure: RADIOACTIVE SEED IMPLANT/BRACHYTHERAPY IMPLANT;  Surgeon: Cleon Gustin, MD;  Location: [facility];  Service: Urology;  Laterality: N/A;   78 seeds implanted  . removal of cyst     back of head  . SPACE OAR INSTILLATION N/A 04/28/2018   Procedure: SPACE OAR INSTILLATION;  Surgeon: Cleon Gustin, MD;  Location: [facility];  Service: Urology;  Laterality: N/A;    Home Medications:  Allergies as of 10/03/2019      Reactions   Codeine Other (See Comments)   Headache dizziness      Medication List       Accurate as of October 03, 2019 12:48 PM. If you have any questions, ask your nurse or doctor.        ALPRAZolam 0.5 MG tablet Commonly  known as: XANAX Take 1 tablet (0.5 mg total) by mouth 3 (three) times daily as needed for anxiety or sleep.   atorvastatin 20 MG tablet Commonly known as: LIPITOR Take 20 mg by mouth daily. On hold per pt   Cialis 20 MG tablet Generic drug: tadalafil Cialis 20 mg tablet   Ciprodex OTIC suspension Generic drug: ciprofloxacin-dexamethasone PLACE 4 DROPS INTO RIGHT EAR BID FOR 14 DAYS   ciprofloxacin 500 MG tablet Commonly known as: CIPRO TK 1 T PO  BID   CoQ10 100 MG Caps Take 1 capsule by mouth daily.   ibuprofen 800 MG tablet Commonly known as: ADVIL On hold   meloxicam 7.5 MG tablet Commonly known as: MOBIC Take 7.5 mg by mouth daily.   tamsulosin 0.4 MG Caps capsule Commonly known as: FLOMAX TK 1 C PO QHS       Allergies:  Allergies  Allergen Reactions  . Codeine Other (See Comments)    Headache dizziness    Family History: Family History  Problem Relation Age of Onset  . Lung cancer Father   . Prostate cancer Neg Hx   . Colon cancer Neg Hx   . Pancreatic cancer Neg Hx   . Breast cancer Neg Hx     Social History:  reports that he has been smoking cigarettes. He has  a 60.00 pack-year smoking history. He has never used smokeless tobacco. He reports current alcohol use. He reports that he does not use drugs.  ROS: All other review of systems were reviewed and are negative except what is noted above in HPI  Laboratory Data: Lab Results  Component Value Date   WBC 11.9 (H) 04/21/2018   HGB 14.6 04/21/2018   HCT 45.2 04/21/2018   MCV 97.2 04/21/2018   PLT 286 04/21/2018    Lab Results  Component Value Date   CREATININE 0.83 04/21/2018    Lab Results  Component Value Date   PSA 0.2 08/09/2019    No results found for: TESTOSTERONE  Lab Results  Component Value Date   HGBA1C 5.4 12/31/2012    Urinalysis    Component Value Date/Time   COLORURINE YELLOW 12/01/2017 2125   APPEARANCEUR CLEAR 12/01/2017 2125   LABSPEC 1.006 12/01/2017  2125   PHURINE 6.0 12/01/2017 2125   GLUCOSEU NEGATIVE 12/01/2017 2125   HGBUR NEGATIVE 12/01/2017 2125   BILIRUBINUR NEGATIVE 12/01/2017 2125   [place] NEGATIVE 12/01/2017 2125   PROTEINUR NEGATIVE 12/01/2017 2125   NITRITE NEGATIVE 12/01/2017 2125   LEUKOCYTESUR NEGATIVE 12/01/2017 2125    Lab Results  Component Value Date   BACTERIA NONE SEEN 12/01/2017    Pertinent Imaging:  No results found for this or any previous visit. No results found for this or any previous visit. No results found for this or any previous visit. No results found for this or any previous visit. No results found for this or any previous visit. No results found for this or any previous visit. No results found for this or any previous visit. No results found for this or any previous visit.  Assessment & Plan:    1. Prostate cancer ([place]) -RTC 6 months with PSA  2. Erectile dysfunction due to arterial insufficiency -increase tadalafil to 10mg      No follow-ups on file.  Nicolette Bang, MD  [facility] [place]

## 2019-10-03 NOTE — Patient Instructions (Signed)
Prostate Cancer  The prostate is a male gland that helps make semen. Prostate cancer is when abnormal cells grow in this gland. Follow these instructions at home:  Take over-the-counter and prescription medicines only as told by your doctor.  Eat a healthy diet.  Get plenty of sleep.  Ask your doctor for help to find a support group for men with prostate cancer.  Keep all follow-up visits as told by your doctor. This is important.  If you have to go to the hospital, let your cancer doctor (oncologist) know.  Touch, hold, hug, and caress your partner to continue to show sexual feelings. Contact a doctor if:  You have trouble peeing (urinating).  You have blood in your pee (urine).  You have pain in your hips, back, or chest. Get help right away if:  You have weakness in your legs.  You lose feeling (have numbness) in your legs.  You cannot control your pee or your poop (stool).  You have trouble breathing.  You have sudden pain in your chest.  You have chills or a fever. Summary  The prostate is a male gland that helps make semen. Prostate cancer is when abnormal cells grow in this gland.  Ask your doctor for help to find a support group for men with prostate cancer.  Contact a doctor if you have problems peeing or have any new pain that you did not have before. This information is not intended to replace advice given to you by your health care provider. Make sure you discuss any questions you have with your health care provider. Document Revised: 06/10/2017 Document Reviewed: 03/08/2016 Elsevier Patient Education  2020 Elsevier Inc.  

## 2019-10-17 DIAGNOSIS — F419 Anxiety disorder, unspecified: Secondary | ICD-10-CM | POA: Diagnosis not present

## 2019-10-17 DIAGNOSIS — G4709 Other insomnia: Secondary | ICD-10-CM | POA: Diagnosis not present

## 2019-10-25 DIAGNOSIS — Z6823 Body mass index (BMI) 23.0-23.9, adult: Secondary | ICD-10-CM | POA: Diagnosis not present

## 2019-10-25 DIAGNOSIS — N401 Enlarged prostate with lower urinary tract symptoms: Secondary | ICD-10-CM | POA: Diagnosis not present

## 2019-10-25 DIAGNOSIS — Z1389 Encounter for screening for other disorder: Secondary | ICD-10-CM | POA: Diagnosis not present

## 2019-10-25 DIAGNOSIS — Z0001 Encounter for general adult medical examination with abnormal findings: Secondary | ICD-10-CM | POA: Diagnosis not present

## 2020-02-28 ENCOUNTER — Other Ambulatory Visit: Payer: Self-pay

## 2020-02-28 DIAGNOSIS — C61 Malignant neoplasm of prostate: Secondary | ICD-10-CM

## 2020-03-28 ENCOUNTER — Other Ambulatory Visit: Payer: PPO

## 2020-03-28 ENCOUNTER — Other Ambulatory Visit: Payer: Self-pay

## 2020-03-28 DIAGNOSIS — C61 Malignant neoplasm of prostate: Secondary | ICD-10-CM

## 2020-03-29 LAB — PSA: Prostate Specific Ag, Serum: 0.2 ng/mL (ref 0.0–4.0)

## 2020-04-01 NOTE — Progress Notes (Signed)
Results mailed 

## 2020-04-02 ENCOUNTER — Encounter: Payer: Self-pay | Admitting: Urology

## 2020-04-02 ENCOUNTER — Ambulatory Visit (INDEPENDENT_AMBULATORY_CARE_PROVIDER_SITE_OTHER): Payer: PPO | Admitting: Urology

## 2020-04-02 ENCOUNTER — Other Ambulatory Visit: Payer: Self-pay

## 2020-04-02 VITALS — BP 137/60 | HR 84 | Temp 98.1°F | Ht 69.0 in | Wt 162.6 lb

## 2020-04-02 DIAGNOSIS — C61 Malignant neoplasm of prostate: Secondary | ICD-10-CM | POA: Diagnosis not present

## 2020-04-02 DIAGNOSIS — N5201 Erectile dysfunction due to arterial insufficiency: Secondary | ICD-10-CM

## 2020-04-02 LAB — URINALYSIS, ROUTINE W REFLEX MICROSCOPIC
Bilirubin, UA: NEGATIVE
Glucose, UA: NEGATIVE
Ketones, UA: NEGATIVE
Leukocytes,UA: NEGATIVE
Nitrite, UA: NEGATIVE
Protein,UA: NEGATIVE
RBC, UA: NEGATIVE
Specific Gravity, UA: 1.015 (ref 1.005–1.030)
Urobilinogen, Ur: 0.2 mg/dL (ref 0.2–1.0)
pH, UA: 7 (ref 5.0–7.5)

## 2020-04-02 MED ORDER — TAMSULOSIN HCL 0.4 MG PO CAPS
0.4000 mg | ORAL_CAPSULE | Freq: Every day | ORAL | 3 refills | Status: DC
Start: 2020-04-02 — End: 2020-11-14

## 2020-04-02 NOTE — Progress Notes (Signed)
Urological Symptom Review  Patient is experiencing the following symptoms: Get up at night to urinate   Review of Systems  Gastrointestinal (upper)  : Negative for upper GI symptoms  Gastrointestinal (lower) : Negative for lower GI symptoms  Constitutional : Negative for symptoms  Skin: Negative for skin symptoms  Eyes: Negative for eye symptoms  Ear/Nose/Throat : Sinus problems  Hematologic/Lymphatic: Negative for Hematologic/Lymphatic symptoms  Cardiovascular : Negative for cardiovascular symptoms  Respiratory : Negative for respiratory symptoms  Endocrine: Negative for endocrine symptoms  Musculoskeletal: Negative for musculoskeletal symptoms  Neurological: Negative for neurological symptoms  Psychologic: Negative for psychiatric symptoms 

## 2020-04-02 NOTE — Patient Instructions (Signed)
Alprostadil intracavernosal injection What is this medicine? ALPROSTADIL (al PROS ta dil) is used to treat erectile dysfunction (ED). This medicine helps to create and maintain an erection. This medicine may be used for other purposes; ask your health care provider or pharmacist if you have questions. COMMON BRAND NAME(S): Caverject, Caverject Impulse, Edex What should I tell my health care provider before I take this medicine? They need to know if you have any of these conditions:  an abnormally formed penis  have been advised not to engage in sexual activity  have ever had a painful erection that lasted more than 4 hours  heart problems  leukemia  low blood pressure  penile implant  sickle cell disease or trait  tumor of the bone marrow (multiple myeloma)  an unusual or allergic reaction to alprostadil or other medicines, foods, dyes, or preservatives How should I use this medicine? This medicine is for injection into the penis. You will be taught how to use this medicine. Use exactly as directed. Do not take your medicine more often than directed. It is important that you put your used needles and syringes in a special sharps container. Do not put them in a trash can. If you do not have a sharps container, call your pharmacist or healthcare provider to get one. This medicine is for use in men only and is not for use in children. Overdosage: If you think you have taken too much of this medicine contact a poison control center or emergency room at once. NOTE: This medicine is only for you. Do not share this medicine with others. What if I miss a dose? You should not use this medicine more than 3 times a week. Each dose should be given at least 24 hours apart. What may interact with this medicine?  medicines for blood pressure This list may not describe all possible interactions. Give your health care provider a list of all the medicines, herbs, non-prescription drugs, or  dietary supplements you use. Also tell them if you smoke, drink alcohol, or use illegal drugs. Some items may interact with your medicine. What should I watch for while using this medicine? Contact your doctor or health care professional right away if you have an erection that lasts longer than 4 hours or if it becomes painful. This may be a sign of a serious problem and must be treated right away to prevent permanent damage. Do not change the dose of your medication. Call your doctor or health care professional to determine if your dose needs to be changed. This medicine does not protect you or your partner against HIV infection (the virus that causes AIDS) or other sexually transmitted diseases. What side effects may I notice from receiving this medicine? Side effects that you should report to your doctor or health care professional as soon as possible:  allergic reactions like skin rash, itching or hives, swelling of the face, lips, or tongue  prolonged or painful erection Side effects that usually do not require medical attention (report to your doctor or health care professional if they continue or are bothersome):  bleeding, bruising, or pain at site of injection  change in blood pressure This list may not describe all possible side effects. Call your doctor for medical advice about side effects. You may report side effects to FDA at 1-800-FDA-1088. Where should I keep my medicine? Keep out of reach of children. Caverject: Store unopened product at or below 25 degrees C (77 degrees F). Do not freeze.  See product instructions for storage when product is in use. Different products may have different instructions for storage. Throw away any unused medicine after the expiration date. Edex: Store unopened product at room temperature between 15 and 30 degrees C (59 and 86 degrees F). Do not freeze. See product instructions for storage when product is in use. Throw away any unused medicine after  the expiration date. When traveling, do not store this medicine in checked luggage during air travel or leave in a closed automobile. NOTE: This sheet is a summary. It may not cover all possible information. If you have questions about this medicine, talk to your doctor, pharmacist, or health care provider.  2020 Elsevier/Gold Standard (2014-11-27 13:19:52)

## 2020-04-02 NOTE — Progress Notes (Signed)
04/02/2020 11:29 AM   David White 18-May-1951 [id number]  Referring provider: Redmond School, [place],  [place] [address]  followup prostate cancer  HPI: David White is a 80TJ here for followup for prostate cancer and erectile dysfunction. PSA stable at 0.2. He has stable mild LUTS on flomax. He has issues getting and maintaining an erection on tadalfil 5-20mg    PMH: Past Medical History:  Diagnosis Date   Anxiety    Arthritis    Hepatitis    hepatitis a as child   Hypercholesteremia    Macular degeneration    left eye   Prostate cancer ([place])    Tobacco abuse     Surgical History: Past Surgical History:  Procedure Laterality Date   APPENDECTOMY     CYSTOSCOPY  04/28/2018   Procedure: CYSTOSCOPY FLEXIBLE;  Surgeon: David Gustin, MD;  Location: [facility];  Service: Urology;;  no seeds found in bladder   EYE SURGERY  10-15 yrs ago   ioc for cataracts   PROSTATE BIOPSY N/A 01/09/2018   Procedure: BIOPSY TRANSRECTAL ULTRASONIC PROSTATE (TUBP);  Surgeon: David Gustin, MD;  Location: [facility];  Service: Urology;  Laterality: N/A;  [place] IMPLANT N/A 04/28/2018   Procedure: RADIOACTIVE SEED IMPLANT/BRACHYTHERAPY IMPLANT;  Surgeon: David Gustin, MD;  Location: [facility];  Service: Urology;  Laterality: N/A;   78 seeds implanted   removal of cyst     back of head   SPACE OAR INSTILLATION N/A 04/28/2018   Procedure: SPACE OAR INSTILLATION;  Surgeon: David Gustin, MD;  Location: [facility];  Service: Urology;  Laterality: N/A;    Home Medications:  Allergies as of 04/02/2020      Reactions   Codeine Other (See Comments)   Headache dizziness      Medication List       Accurate as of April 02, 2020 11:29 AM. If you have any questions, ask your nurse or doctor.        ALPRAZolam 0.5 MG tablet Commonly known as: XANAX Take 1 tablet (0.5  mg total) by mouth 3 (three) times daily as needed for anxiety or sleep.   atorvastatin 20 MG tablet Commonly known as: LIPITOR Take 20 mg by mouth daily. On hold per pt   Ciprodex OTIC suspension Generic drug: ciprofloxacin-dexamethasone PLACE 4 DROPS INTO RIGHT EAR BID FOR 14 DAYS   ciprofloxacin 500 MG tablet Commonly known as: CIPRO TK 1 T PO  BID   clotrimazole-betamethasone cream Commonly known as: LOTRISONE Apply topically 2 (two) times daily.   CoQ10 100 MG Caps Take 1 capsule by mouth daily.   ibuprofen 800 MG tablet Commonly known as: ADVIL On hold   meloxicam 7.5 MG tablet Commonly known as: MOBIC Take 7.5 mg by mouth daily.   tadalafil 20 MG tablet Commonly known as: Cialis Take 1 tablet (20 mg total) by mouth as needed for erectile dysfunction.   tamsulosin 0.4 MG Caps capsule Commonly known as: FLOMAX TK 1 C PO QHS       Allergies:  Allergies  Allergen Reactions   Codeine Other (See Comments)    Headache dizziness    Family History: Family History  Problem Relation Age of Onset   Lung cancer Father    Prostate cancer Neg Hx    Colon cancer Neg Hx    Pancreatic cancer Neg Hx    Breast cancer Neg Hx  Social History:  reports that he has been smoking cigarettes. He has a 60.00 pack-year smoking history. He has never used smokeless tobacco. He reports current alcohol use. He reports that he does not use drugs.  ROS: All other review of systems were reviewed and are negative except what is noted above in HPI  Physical Exam: BP 137/60    Pulse 84    Temp 98.1 F (36.7 C)    Ht 5\' 9"  (1.753 m)    Wt 162 lb 9.6 oz (73.8 kg)    BMI 24.01 kg/m   Constitutional:  Alert and oriented, No acute distress. HEENT: [place] AT, moist mucus membranes.  Trachea midline, no masses. Cardiovascular: No clubbing, cyanosis, or edema. Respiratory: Normal respiratory effort, no increased work of breathing. GI: Abdomen is soft, nontender, nondistended, no  abdominal masses GU: No CVA tenderness.  Lymph: No cervical or inguinal lymphadenopathy. Skin: No rashes, bruises or suspicious lesions. Neurologic: Grossly intact, no focal deficits, moving all 4 extremities. Psychiatric: Normal mood and affect.  Laboratory Data: Lab Results  Component Value Date   WBC 11.9 (H) 04/21/2018   HGB 14.6 04/21/2018   HCT 45.2 04/21/2018   MCV 97.2 04/21/2018   PLT 286 04/21/2018    Lab Results  Component Value Date   CREATININE 0.83 04/21/2018    Lab Results  Component Value Date   PSA 0.2 08/09/2019    No results found for: TESTOSTERONE  Lab Results  Component Value Date   HGBA1C 5.4 12/31/2012    Urinalysis    Component Value Date/Time   COLORURINE YELLOW 12/01/2017 2125   APPEARANCEUR CLEAR 12/01/2017 2125   LABSPEC 1.006 12/01/2017 2125   PHURINE 6.0 12/01/2017 2125   GLUCOSEU NEGATIVE 12/01/2017 2125   HGBUR NEGATIVE 12/01/2017 2125   [place] NEGATIVE 12/01/2017 2125   [place] NEGATIVE 12/01/2017 2125   PROTEINUR NEGATIVE 12/01/2017 2125   NITRITE NEGATIVE 12/01/2017 2125   LEUKOCYTESUR NEGATIVE 12/01/2017 2125    Lab Results  Component Value Date   BACTERIA NONE SEEN 12/01/2017    Pertinent Imaging:  No results found for this or any previous visit.  No results found for this or any previous visit.  No results found for this or any previous visit.  No results found for this or any previous visit.  No results found for this or any previous visit.  No results found for this or any previous visit.  No results found for this or any previous visit.  No results found for this or any previous visit.   Assessment & Plan:    1. Prostate cancer ([place]) -RTC 6 months with PSA - Urinalysis, Routine w reflex microscopic  2. Erectile dysfunction due to arterial insufficiency We discussed PDE5s, ICI, VED, MUSe and IPP. The patient will call with his decision.   No follow-ups on file.  Nicolette Bang,  MD  [facility] [place]

## 2020-06-02 DIAGNOSIS — M1990 Unspecified osteoarthritis, unspecified site: Secondary | ICD-10-CM | POA: Diagnosis not present

## 2020-06-02 DIAGNOSIS — F419 Anxiety disorder, unspecified: Secondary | ICD-10-CM | POA: Diagnosis not present

## 2020-06-27 DIAGNOSIS — R197 Diarrhea, unspecified: Secondary | ICD-10-CM | POA: Diagnosis not present

## 2020-06-27 DIAGNOSIS — Z6823 Body mass index (BMI) 23.0-23.9, adult: Secondary | ICD-10-CM | POA: Diagnosis not present

## 2020-06-30 DIAGNOSIS — Z6822 Body mass index (BMI) 22.0-22.9, adult: Secondary | ICD-10-CM | POA: Diagnosis not present

## 2020-06-30 DIAGNOSIS — R197 Diarrhea, unspecified: Secondary | ICD-10-CM | POA: Diagnosis not present

## 2020-06-30 DIAGNOSIS — I700 Atherosclerosis of aorta: Secondary | ICD-10-CM | POA: Diagnosis not present

## 2020-06-30 DIAGNOSIS — N400 Enlarged prostate without lower urinary tract symptoms: Secondary | ICD-10-CM | POA: Diagnosis not present

## 2020-06-30 DIAGNOSIS — K5289 Other specified noninfective gastroenteritis and colitis: Secondary | ICD-10-CM | POA: Diagnosis not present

## 2020-07-01 DIAGNOSIS — R197 Diarrhea, unspecified: Secondary | ICD-10-CM | POA: Diagnosis not present

## 2020-07-05 ENCOUNTER — Emergency Department (HOSPITAL_COMMUNITY): Payer: PPO

## 2020-07-05 ENCOUNTER — Emergency Department (HOSPITAL_COMMUNITY)
Admission: EM | Admit: 2020-07-05 | Discharge: 2020-07-05 | Disposition: A | Discharge status: Home / Self Care | Payer: PPO | Attending: Emergency Medicine | Admitting: Emergency Medicine

## 2020-07-05 ENCOUNTER — Encounter (HOSPITAL_COMMUNITY): Payer: Self-pay | Admitting: *Deleted

## 2020-07-05 DIAGNOSIS — F17210 Nicotine dependence, cigarettes, uncomplicated: Secondary | ICD-10-CM | POA: Diagnosis not present

## 2020-07-05 DIAGNOSIS — Z20822 Contact with and (suspected) exposure to covid-19: Secondary | ICD-10-CM | POA: Insufficient documentation

## 2020-07-05 DIAGNOSIS — K529 Noninfective gastroenteritis and colitis, unspecified: Secondary | ICD-10-CM | POA: Insufficient documentation

## 2020-07-05 DIAGNOSIS — Z8546 Personal history of malignant neoplasm of prostate: Secondary | ICD-10-CM | POA: Insufficient documentation

## 2020-07-05 DIAGNOSIS — R109 Unspecified abdominal pain: Secondary | ICD-10-CM | POA: Diagnosis not present

## 2020-07-05 DIAGNOSIS — R9431 Abnormal electrocardiogram [ECG] [EKG]: Secondary | ICD-10-CM | POA: Diagnosis not present

## 2020-07-05 LAB — COMPREHENSIVE METABOLIC PANEL
ALT: 18 U/L (ref 0–44)
AST: 21 U/L (ref 15–41)
Albumin: 3.8 g/dL (ref 3.5–5.0)
Alkaline Phosphatase: 93 U/L (ref 38–126)
Anion gap: 9 (ref 5–15)
BUN: 15 mg/dL (ref 8–23)
CO2: 23 mmol/L (ref 22–32)
Calcium: 8.8 mg/dL — ABNORMAL LOW (ref 8.9–10.3)
Chloride: 101 mmol/L (ref 98–111)
Creatinine, Ser: 0.91 mg/dL (ref 0.61–1.24)
GFR, Estimated: >60 mL/min (ref >60)
Glucose, Bld: 93 mg/dL (ref 70–99)
Potassium: 3.9 mmol/L (ref 3.5–5.1)
Sodium: 133 mmol/L — ABNORMAL LOW (ref 135–145)
Total Bilirubin: 0.4 mg/dL (ref 0.3–1.2)
Total Protein: 7.2 g/dL (ref 6.5–8.1)

## 2020-07-05 LAB — URINALYSIS, ROUTINE W REFLEX MICROSCOPIC
Bilirubin Urine: NEGATIVE
Glucose, UA: NEGATIVE mg/dL
Hgb urine dipstick: NEGATIVE
Ketones, ur: 5 mg/dL — AB
Leukocytes,Ua: NEGATIVE
Nitrite: NEGATIVE
Protein, ur: NEGATIVE mg/dL
Specific Gravity, Urine: 1.013 (ref 1.005–1.030)
pH: 6 (ref 5.0–8.0)

## 2020-07-05 LAB — RESP PANEL BY RT-PCR (FLU A&B, COVID) ARPGX2
Influenza A by PCR: NEGATIVE
Influenza B by PCR: NEGATIVE
SARS Coronavirus 2 by RT PCR: NEGATIVE

## 2020-07-05 LAB — C DIFFICILE (CDIFF) QUICK SCRN (NO PCR REFLEX)
C Diff antigen: NEGATIVE
C Diff interpretation: NOT DETECTED
C Diff toxin: NEGATIVE

## 2020-07-05 LAB — CBC WITH DIFFERENTIAL/PLATELET
Abs Immature Granulocytes: 0.04 10*3/uL (ref 0.00–0.07)
Basophils Absolute: 0.1 10*3/uL (ref 0.0–0.1)
Basophils Relative: 1 %
Eosinophils Absolute: 0.5 10*3/uL (ref 0.0–0.5)
Eosinophils Relative: 5 %
HCT: 42.9 % (ref 39.0–52.0)
Hemoglobin: 14.1 g/dL (ref 13.0–17.0)
Immature Granulocytes: 0 %
Lymphocytes Relative: 20 %
Lymphs Abs: 2.1 10*3/uL (ref 0.7–4.0)
MCH: 31.8 pg (ref 26.0–34.0)
MCHC: 32.9 g/dL (ref 30.0–36.0)
MCV: 96.8 fL (ref 80.0–100.0)
Monocytes Absolute: 0.7 10*3/uL (ref 0.1–1.0)
Monocytes Relative: 7 %
Neutro Abs: 7 10*3/uL (ref 1.7–7.7)
Neutrophils Relative %: 67 %
Platelets: 309 10*3/uL (ref 150–400)
RBC: 4.43 MIL/uL (ref 4.22–5.81)
RDW: 12.7 % (ref 11.5–15.5)
WBC: 10.5 10*3/uL (ref 4.0–10.5)
nRBC: 0 % (ref 0.0–0.2)

## 2020-07-05 LAB — LIPASE, BLOOD: Lipase: 39 U/L (ref 11–51)

## 2020-07-05 IMAGING — CT CT ABD-PELV W/ CM
2 of 5 series · 16 of 46 positions shown, 18 images · IV contrast (omnipaque)
Comparison: [DATE]

CLINICAL DATA: RIGHT-side and BILATERAL lower abdominal pain with
diarrhea for 2 weeks

EXAM:
CT ABDOMEN AND PELVIS WITH CONTRAST
TECHNIQUE: Multidetector CT imaging of the abdomen and pelvis was performed
using the standard protocol following bolus administration of
intravenous contrast. Sagittal and coronal MPR images reconstructed
from axial data set.
CONTRAST:  100mL OMNIPAQUE IOHEXOL 300 MG/ML SOLN IV. No oral
contrast.

[Series 2: axial st · axial · 0.78mm/px · z∈[+1156,+1576]mm · 13 of 96 slices shown, 15 images]
[im 6/96  soft-tissue]
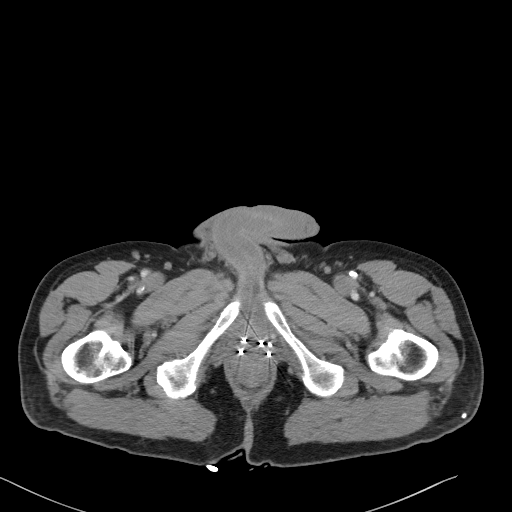
[im 6/96  bone]
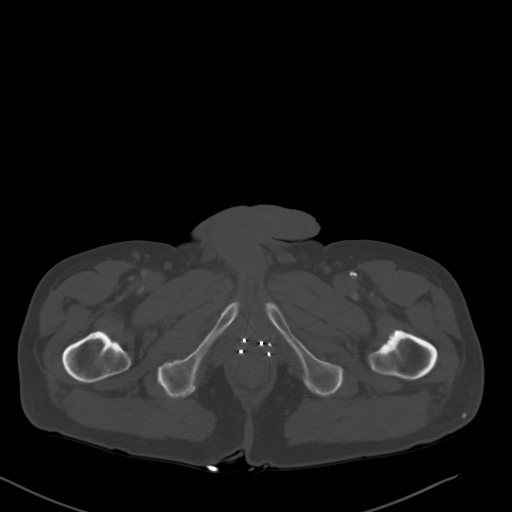
[im 11/96  soft-tissue]
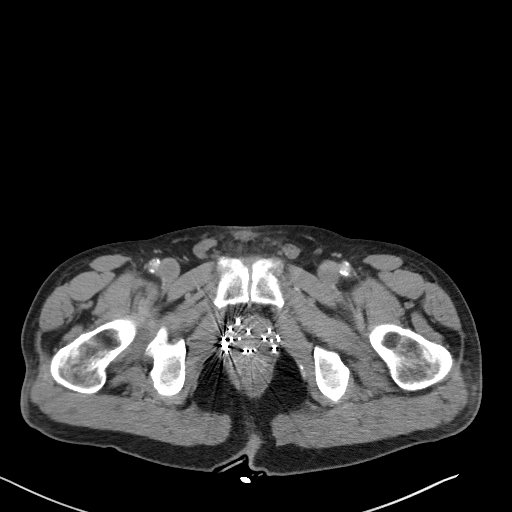
[im 22/96  soft-tissue]
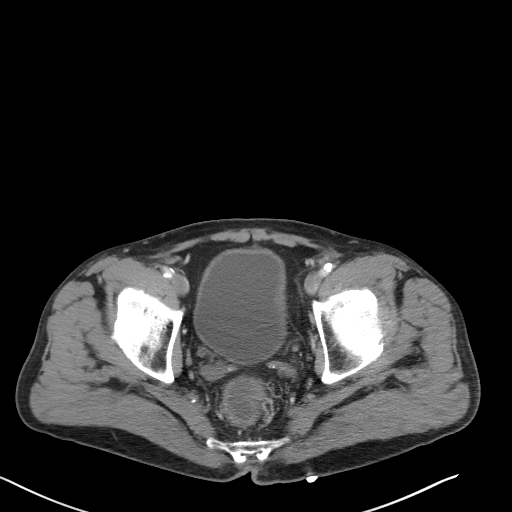
[im 27/96  soft-tissue]
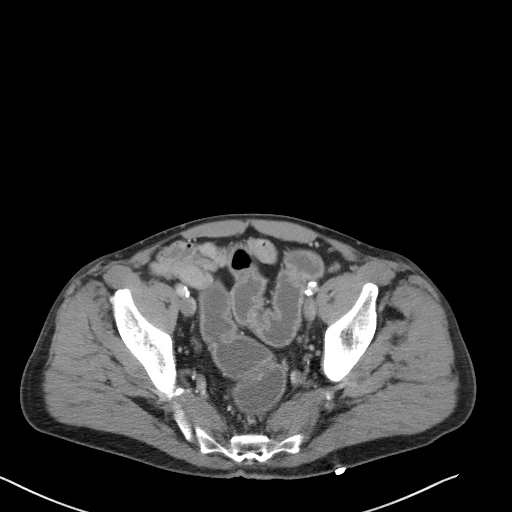
[im 32/96  soft-tissue]
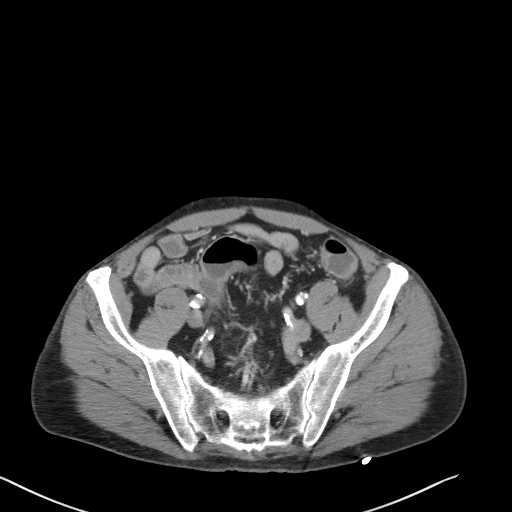
[im 43/96  soft-tissue]
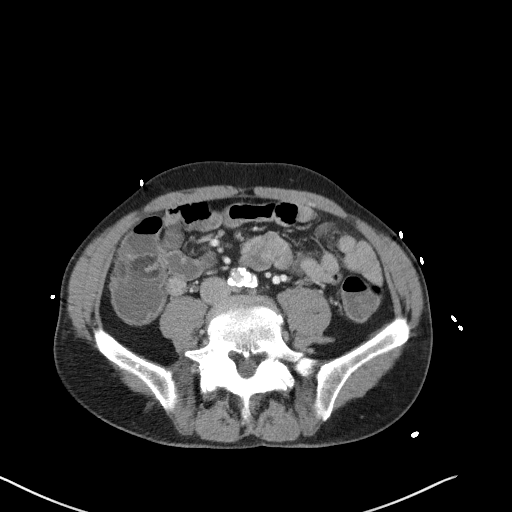
[im 48/96  soft-tissue]
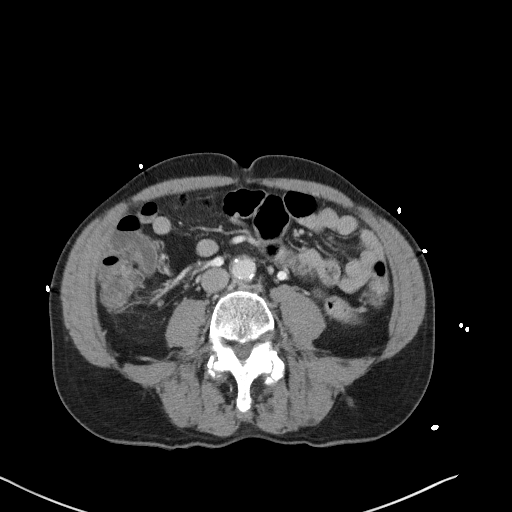
[im 53/96  soft-tissue]
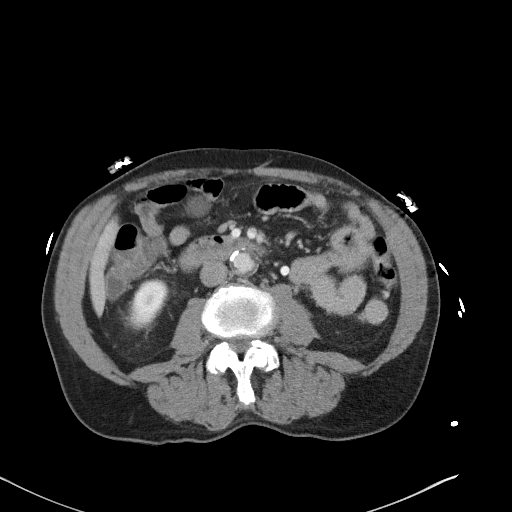
[im 64/96  soft-tissue]
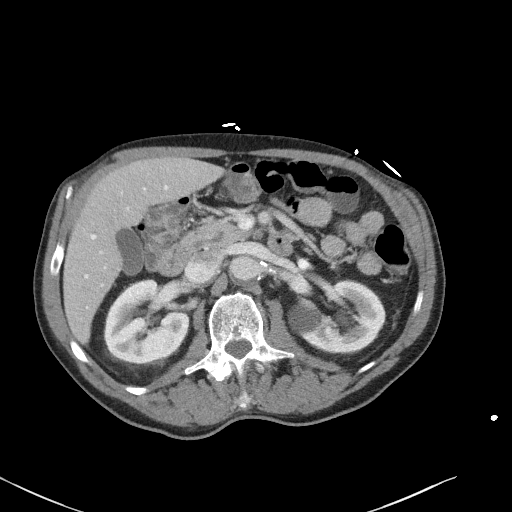
[im 64/96  bone]
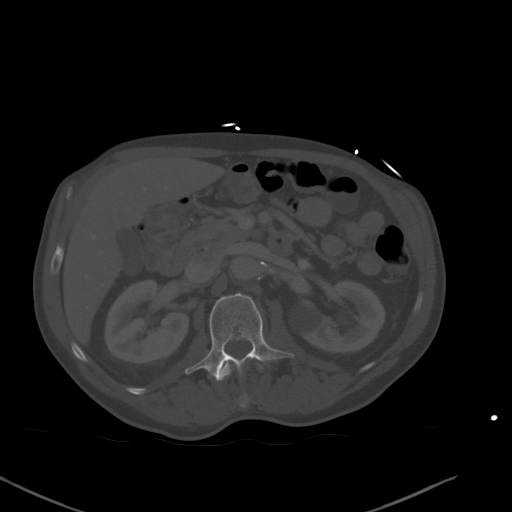
[im 69/96  soft-tissue]
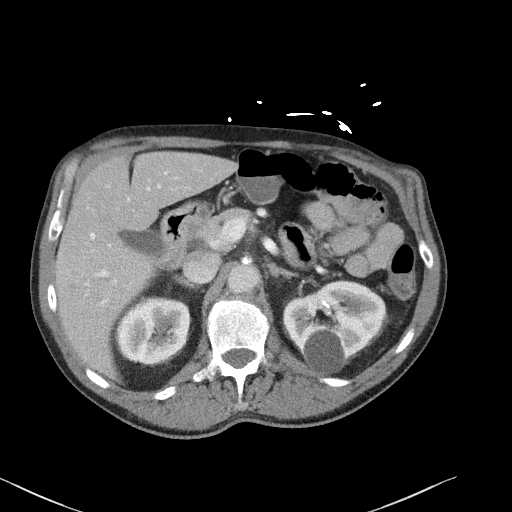
[im 74/96  soft-tissue]
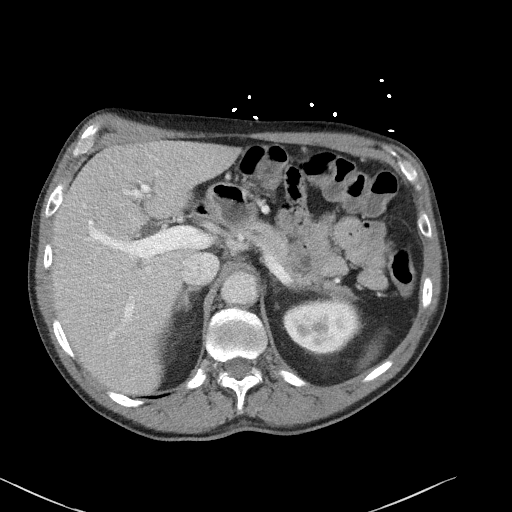
[im 85/96  soft-tissue]
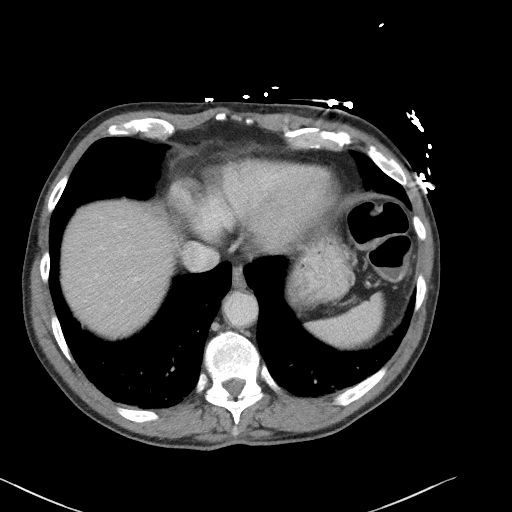
[im 90/96  soft-tissue]
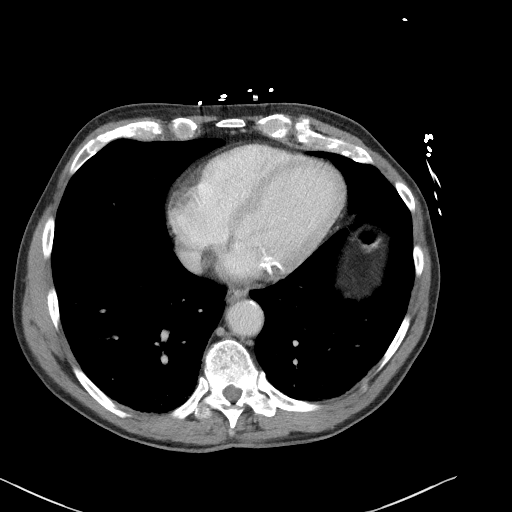

[Series 5: coronal st · coronal · 0.83mm/px · 3 of 89 slices shown]
[im 30/89  soft-tissue]
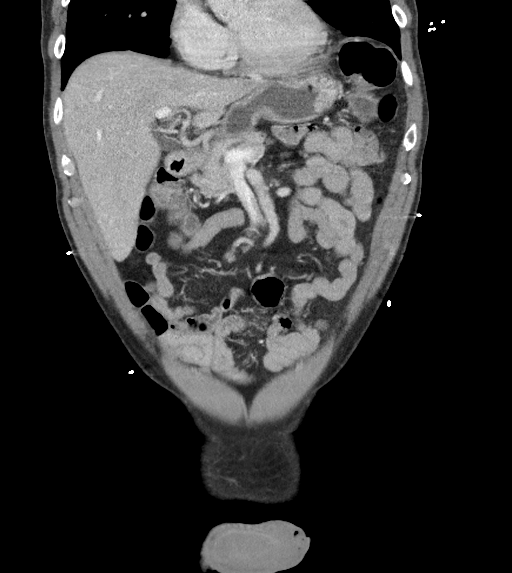
[im 40/89  soft-tissue]
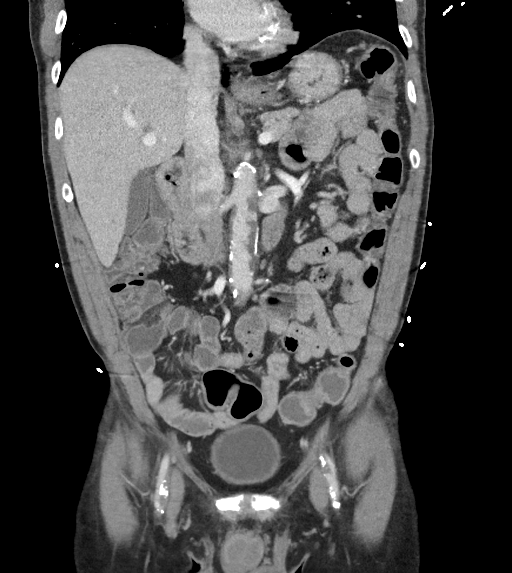
[im 49/89  soft-tissue]
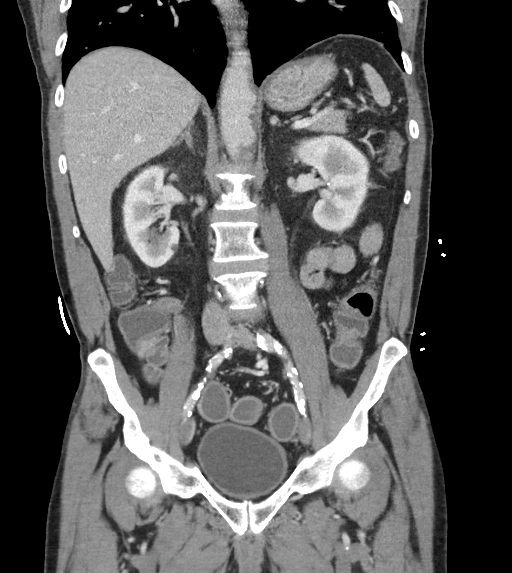

[16 of 46 positions shown; findings below may reference images not displayed]

FINDINGS: Lower chest: Dependent atelectasis at lung bases

Hepatobiliary: Tiny hepatic cysts. Gallbladder and liver otherwise
normal appearance. No biliary dilatation.

Pancreas: Normal appearance

Spleen: Normal appearance

Adrenals/Urinary Tract: Adrenal glands normal appearance. BILATERAL
renal cysts. Renal vascular calcifications without discrete renal
calculus. No hydronephrosis or hydroureter. Bladder unremarkable.

Stomach/Bowel: Appendix surgically absent by history. Stomach and
small bowel loops normal appearance. Bowel wall thickening and mild
hyperemia of rectum and distal sigmoid colon consistent with
colitis. Remainder of colon normal appearance. No evidence of
perforation or abscess.

Vascular/Lymphatic: Extensive atherosclerotic calcifications aorta,
iliac arteries, and femoral arteries, with calcification seen at
visualized coronary arteries. Atherosclerotic calcification at
origin of SMA. Aorta normal caliber. No adenopathy.

Reproductive: Brachytherapy seed implants at prostate bed. Seminal
vesicles unremarkable.

Other: No free air or free fluid. Tiny umbilical hernia containing
fat.

Musculoskeletal: Facet degenerative changes lumbar spine.
IMPRESSION: Colitis involving the rectum and distal sigmoid colon; this could be
due to infection or inflammatory bowel disease, ischemia considered
less likely.

No evidence of perforation or abscess.

Extensive atherosclerotic disease changes including coronary
arteries and origin of SMA.

Tiny umbilical hernia containing fat.

Aortic Atherosclerosis ([BN]-[BN]).

## 2020-07-05 MED ORDER — LOPERAMIDE HCL 2 MG PO CAPS: 4.0000 mg | ORAL_CAPSULE | Freq: Four times a day (QID) | ORAL | 0 refills | Status: DC | PRN

## 2020-07-05 MED ORDER — SODIUM CHLORIDE 0.9 % IV BOLUS
1000.0000 mL | Freq: Once | INTRAVENOUS | Status: AC
  Administered 2020-07-05: 16:00:00 1000 mL via INTRAVENOUS

## 2020-07-05 MED ORDER — METRONIDAZOLE 500 MG PO TABS: 500.0000 mg | ORAL_TABLET | Freq: Two times a day (BID) | ORAL | 0 refills | Status: DC

## 2020-07-05 MED ORDER — HYDROCODONE-ACETAMINOPHEN 5-325 MG PO TABS: 1.0000 | ORAL_TABLET | ORAL | 0 refills | Status: DC | PRN

## 2020-07-05 MED ORDER — IOHEXOL 300 MG/ML  SOLN
100.0000 mL | Freq: Once | INTRAMUSCULAR | Status: AC | PRN
  Administered 2020-07-05: 17:00:00 100 mL via INTRAVENOUS

## 2020-07-05 MED ORDER — ONDANSETRON HCL 4 MG/2ML IJ SOLN
4.0000 mg | Freq: Once | INTRAMUSCULAR | Status: AC
  Administered 2020-07-05: 16:00:00 4 mg via INTRAVENOUS
  Filled 2020-07-05: qty 2

## 2020-07-05 MED ORDER — CIPROFLOXACIN HCL 500 MG PO TABS: 500.0000 mg | ORAL_TABLET | Freq: Two times a day (BID) | ORAL | 0 refills | Status: DC

## 2020-07-05 MED ORDER — CIPROFLOXACIN HCL 250 MG PO TABS
500.0000 mg | ORAL_TABLET | Freq: Once | ORAL | Status: AC
  Administered 2020-07-05: 500 mg via ORAL
  Filled 2020-07-05: qty 2

## 2020-07-05 MED ORDER — SODIUM CHLORIDE 0.9 % IV SOLN
2.0000 g | Freq: Once | INTRAVENOUS | Status: DC
  Filled 2020-07-05: qty 20

## 2020-07-05 MED ORDER — HYDROCODONE-ACETAMINOPHEN 5-325 MG PO TABS
1.0000 | ORAL_TABLET | Freq: Once | ORAL | Status: AC
  Administered 2020-07-05: 1 via ORAL
  Filled 2020-07-05: qty 1

## 2020-07-05 MED ORDER — METRONIDAZOLE 500 MG PO TABS
500.0000 mg | ORAL_TABLET | Freq: Three times a day (TID) | ORAL | Status: DC
  Administered 2020-07-05: 22:00:00 500 mg via ORAL
  Filled 2020-07-05: qty 1

## 2020-07-05 MED ORDER — FENTANYL CITRATE (PF) 100 MCG/2ML IJ SOLN
50.0000 ug | Freq: Once | INTRAMUSCULAR | Status: AC
  Administered 2020-07-05: 16:00:00 50 ug via INTRAVENOUS
  Filled 2020-07-05: qty 2

## 2020-07-05 NOTE — Discharge Instructions (Signed)
Take the medications as prescribed.  Stop taking your prior medications given to you by your PCP.  Follow-up with GI.  His contact information is listed on your discharge paperwork.  You will need to call to schedule an appointment.  Return for any worsening symptoms

## 2020-07-05 NOTE — ED Provider Notes (Signed)
[facility] EMERGENCY DEPARTMENT Provider Note   CSN: [id number] Arrival date & time: 07/05/20  1450    History Chief Complaint  Patient presents with  . Diarrhea    David White is a 69 y.o. male with past medical history significant for prostate cancer remission, tobacco use who presents for evaluation of abdominal pain and diarrhea.  States began one half, 2 weeks ago.  Has been taking Imodium however states this did not work.  States about 1 week ago he noticed diffuse lower abdominal pain.  States sometimes it is to his left lower quadrant sometimes to his right.  He was seen by his PCP.  Was started on additional medication.  The medication did not work and he was seen again by his PCP who started him on antibiotics.  Patient does not know what this is.  Started the antibiotics Monday evening.  He is still currently on his antibiotics.  No history of diverticulitis per patient.  Prior to his diarrhea he was on any antibiotics.  He does travel for work however not out of the country.  No one around him is been sick.  He denies fever, chills, nausea, vomiting, chest pain, shortness of breath, dysuria, testicular pain, redness, lateral leg swelling, redness or warmth.  States he does have one side of his scrotum which is larger than the other due to what sounds like a hernia.  It is not any larger than normal.  Feels generally weak.  He is unable to quantify how many episodes of diarrhea he is having daily.  He denies any melena or bright blood per rectum.  Denies additional aggravating or alleviating factors  Unvaccinated against COVID, no recent exposure  History obtained from patient and past medical records.  No interpreter used  HPI     Past Medical History:  Diagnosis Date  . Anxiety   . Arthritis   . Hepatitis    hepatitis a as child  . Hypercholesteremia   . Macular degeneration    left eye  . Prostate cancer ([place])   . Tobacco abuse     Patient Active Problem List    Diagnosis Date Noted  . Erectile dysfunction due to arterial insufficiency 10/03/2019  . Prostate cancer ([place]) 02/15/2018  . Chest pain 12/31/2012  . Grief reaction 12/31/2012  . Tobacco abuse 12/31/2012  . Hyperglycemia 12/31/2012    Past Surgical History:  Procedure Laterality Date  . APPENDECTOMY    . CYSTOSCOPY  04/28/2018   Procedure: CYSTOSCOPY FLEXIBLE;  Surgeon: Cleon Gustin, MD;  Location: [facility];  Service: Urology;;  no seeds found in bladder  . EYE SURGERY  10-15 yrs ago   ioc for cataracts  . PROSTATE BIOPSY N/A 01/09/2018   Procedure: BIOPSY TRANSRECTAL ULTRASONIC PROSTATE (TUBP);  Surgeon: Cleon Gustin, MD;  Location: [facility];  Service: Urology;  Laterality: N/A;  30 MINS  . RADIOACTIVE SEED IMPLANT N/A 04/28/2018   Procedure: RADIOACTIVE SEED IMPLANT/BRACHYTHERAPY IMPLANT;  Surgeon: Cleon Gustin, MD;  Location: [facility];  Service: Urology;  Laterality: N/A;   78 seeds implanted  . removal of cyst     back of head  . SPACE OAR INSTILLATION N/A 04/28/2018   Procedure: SPACE OAR INSTILLATION;  Surgeon: Cleon Gustin, MD;  Location: [facility];  Service: Urology;  Laterality: N/A;       Family History  Problem Relation Age of Onset  . Lung cancer Father   .  Prostate cancer Neg Hx   . Colon cancer Neg Hx   . Pancreatic cancer Neg Hx   . Breast cancer Neg Hx     Social History   Tobacco Use  . Smoking status: Current Every Day Smoker    Packs/day: 1.50    Years: 40.00    Pack years: 60.00    Types: Cigarettes  . Smokeless tobacco: Never Used  Vaping Use  . Vaping Use: Never used  Substance Use Topics  . Alcohol use: Yes    Comment: occ  . Drug use: Never    Home Medications Prior to Admission medications   Medication Sig Start Date End Date Taking? Authorizing Provider  ALPRAZolam Duanne Moron) 0.5 MG tablet Take 1 tablet (0.5 mg total) by mouth 3 (three) times daily as needed for  anxiety or sleep. 01/01/13  Yes Rexene Alberts, MD  CIPRODEX OTIC suspension PLACE 4 DROPS INTO RIGHT EAR BID FOR 14 DAYS 05/10/18  Yes [provider]  clotrimazole-betamethasone (LOTRISONE) cream Apply topically 2 (two) times daily. 02/14/20  Yes [provider]  diphenoxylate-atropine (LOMOTIL) 2.5-0.025 MG tablet  07/04/20  Yes [provider]  ipratropium (ATROVENT) 0.06 % nasal spray SMARTSIG:2 Spray(s) Both Nares Twice Daily PRN 04/07/20  Yes [provider]  tadalafil (CIALIS) 20 MG tablet Take 1 tablet (20 mg total) by mouth as needed for erectile dysfunction. 10/03/19  Yes McKenzie, Candee Furbish, MD  atorvastatin (LIPITOR) 20 MG tablet Take 20 mg by mouth daily. On hold per pt Patient not taking: No sig reported 10/24/17   [provider]  ciprofloxacin (CIPRO) 500 MG tablet Take 1 tablet (500 mg total) by mouth every 12 (twelve) hours. 07/05/20   Maximillian Habibi A, PA-C  Coenzyme Q10 (COQ10) 100 MG CAPS Take 1 capsule by mouth daily. Patient not taking: No sig reported    [provider]  HYDROcodone-acetaminophen (NORCO/VICODIN) 5-325 MG tablet Take 1 tablet by mouth every 4 (four) hours as needed. 07/05/20   Jarvin Ogren A, PA-C  ibuprofen (ADVIL,MOTRIN) 800 MG tablet On hold Patient not taking: Reported on 04/02/2020    [provider]  loperamide (IMODIUM) 2 MG capsule Take 2 capsules (4 mg total) by mouth 4 (four) times daily as needed for diarrhea or loose stools. 07/05/20   Daysie Helf A, PA-C  meloxicam (MOBIC) 7.5 MG tablet Take 7.5 mg by mouth daily. Patient not taking: No sig reported    [provider]  metroNIDAZOLE (FLAGYL) 500 MG tablet Take 1 tablet (500 mg total) by mouth 2 (two) times daily. 07/05/20   Katieann Hungate A, PA-C  tamsulosin (FLOMAX) 0.4 MG CAPS capsule Take 1 capsule (0.4 mg total) by mouth daily after supper. Patient not taking: Reported on 07/05/2020 04/02/20   Cleon Gustin,  MD    Allergies    Codeine  Review of Systems   Review of Systems  Constitutional: Negative.   HENT: Negative.   Respiratory: Negative.   Cardiovascular: Negative.   Gastrointestinal: Positive for abdominal pain and diarrhea. Negative for abdominal distention, anal bleeding, blood in stool, constipation, nausea, rectal pain and vomiting.  Genitourinary: Negative.   Musculoskeletal: Negative.   Skin: Negative.   Neurological: Positive for weakness (Generalized weakness). Negative for dizziness, tremors, seizures, syncope, facial asymmetry, speech difficulty, light-headedness, numbness and headaches.  All other systems reviewed and are negative.   Physical Exam Updated Vital Signs BP (!) 144/72 (BP Location: Right Arm)   Pulse 78   Temp 97.8 F (36.6  C) (Oral)   Resp 10   Ht 5\' 9"  (1.753 m)   Wt 71.7 kg   SpO2 95%   BMI 23.33 kg/m   Physical Exam Vitals and nursing note reviewed.  Constitutional:      General: He is not in acute distress.    Appearance: He is well-developed and well-nourished. He is not ill-appearing, toxic-appearing or diaphoretic.  HENT:     Head: Normocephalic and atraumatic.     Nose: Nose normal.     Mouth/Throat:     Mouth: Mucous membranes are moist.  Eyes:     Pupils: Pupils are equal, round, and reactive to light.  Cardiovascular:     Rate and Rhythm: Normal rate and regular rhythm.     Pulses: Normal pulses.     Heart sounds: Normal heart sounds.  Pulmonary:     Effort: Pulmonary effort is normal. No respiratory distress.     Breath sounds: Normal breath sounds.  Abdominal:     General: Bowel sounds are normal. There is no distension.     Palpations: Abdomen is soft.     Tenderness: There is abdominal tenderness in the right lower quadrant and left lower quadrant. There is no right CVA tenderness, left CVA tenderness, guarding or rebound.     Comments: Hyperactive bowel sounds.  Tenderness to right lower quadrant left lower quadrant.   No rebound or guarding.  Musculoskeletal:        General: Normal range of motion.     Cervical back: Normal range of motion and neck supple.     Comments: Moves all 4 extremities at difficulty.  No bony tenderness.  Skin:    General: Skin is warm and dry.     Capillary Refill: Capillary refill takes 2 to 3 seconds.     Comments: No edema, erythema or warmth.  No fluctuance or induration.  Neurological:     General: No focal deficit present.     Mental Status: He is alert.     Cranial Nerves: Cranial nerves are intact.     Sensory: Sensation is intact.     Motor: Motor function is intact.     Comments: Cranial nerves II 12 grossly intact.  No facial droop.  Ambulatory in ED without difficulty  Psychiatric:        Mood and Affect: Mood and affect normal.    ED Results / Procedures / Treatments   Labs (all labs ordered are listed, but only abnormal results are displayed) Labs Reviewed  COMPREHENSIVE METABOLIC PANEL - Abnormal; Notable for the following components:      Result Value   Sodium 133 (*)    Calcium 8.8 (*)    All other components within normal limits  URINALYSIS, ROUTINE W REFLEX MICROSCOPIC - Abnormal; Notable for the following components:   Ketones, ur 5 (*)    All other components within normal limits  C DIFFICILE (CDIFF) QUICK SCRN (NO PCR REFLEX)  RESP PANEL BY RT-PCR (FLU A&B, COVID) [id number]  GASTROINTESTINAL PANEL BY PCR, STOOL (REPLACES STOOL CULTURE)  CBC WITH DIFFERENTIAL/PLATELET  LIPASE, BLOOD    EKG None  Radiology CT Abdomen Pelvis W Contrast  Result Date: 07/05/2020 CLINICAL DATA:  RIGHT-side and BILATERAL lower abdominal pain with diarrhea for 2 weeks EXAM: CT ABDOMEN AND PELVIS WITH CONTRAST TECHNIQUE: Multidetector CT imaging of the abdomen and pelvis was performed using the standard protocol following bolus administration of intravenous contrast. Sagittal and coronal MPR images reconstructed from axial data set. CONTRAST:  111mL OMNIPAQUE  IOHEXOL 300 MG/ML SOLN IV. No oral contrast. COMPARISON:  02/10/2018 FINDINGS: Lower chest: Dependent atelectasis at lung bases Hepatobiliary: Tiny hepatic cysts. Gallbladder and liver otherwise normal appearance. No biliary dilatation. Pancreas: Normal appearance Spleen: Normal appearance Adrenals/Urinary Tract: Adrenal glands normal appearance. BILATERAL renal cysts. Renal vascular calcifications without discrete renal calculus. No hydronephrosis or hydroureter. Bladder unremarkable. Stomach/Bowel: Appendix surgically absent by history. Stomach and small bowel loops normal appearance. Bowel wall thickening and mild hyperemia of rectum and distal sigmoid colon consistent with colitis. Remainder of colon normal appearance. No evidence of perforation or abscess. Vascular/Lymphatic: Extensive atherosclerotic calcifications aorta, iliac arteries, and femoral arteries, with calcification seen at visualized coronary arteries. Atherosclerotic calcification at origin of SMA. Aorta normal caliber. No adenopathy. Reproductive: Brachytherapy seed implants at prostate bed. Seminal vesicles unremarkable. Other: No free air or free fluid. Tiny umbilical hernia containing fat. Musculoskeletal: Facet degenerative changes lumbar spine. IMPRESSION: Colitis involving the rectum and distal sigmoid colon; this could be due to infection or inflammatory bowel disease, ischemia considered less likely. No evidence of perforation or abscess. Extensive atherosclerotic disease changes including coronary arteries and origin of SMA. Tiny umbilical hernia containing fat. Aortic Atherosclerosis (ICD10-I70.0). Electronically Signed   By: Lavonia Dana M.D.   On: 07/05/2020 17:36    Procedures Procedures (including critical care time)  Medications Ordered in ED Medications  metroNIDAZOLE (FLAGYL) tablet 500 mg (has no administration in time range)  sodium chloride 0.9 % bolus 1,000 mL (0 mLs Intravenous Stopped 07/05/20 1806)  ondansetron  (ZOFRAN) injection 4 mg (4 mg Intravenous Given 07/05/20 1600)  fentaNYL (SUBLIMAZE) injection 50 mcg (50 mcg Intravenous Given 07/05/20 1559)  iohexol (OMNIPAQUE) 300 MG/ML solution 100 mL (100 mLs Intravenous Contrast Given 07/05/20 1713)  HYDROcodone-acetaminophen (NORCO/VICODIN) 5-325 MG per tablet 1 tablet (1 tablet Oral Given 07/05/20 2116)  ciprofloxacin (CIPRO) tablet 500 mg (500 mg Oral Given 07/05/20 2116)   ED Course  I have reviewed the triage vital signs and the nursing notes.  Pertinent labs & imaging results that were available during my care of the patient were reviewed by me and considered in my medical decision making (see chart for details).  69 year old presents for evaluation of diarrhea and lower abdominal pain x2 weeks.  No antibiotics prior to that.  Has been seen by his PCP twice.  Currently on antibiotics, day 6.  He is unsure what this is.  He is afebrile, nonseptic, not ill-appearing.  He has diffuse tenderness to his right lower quadrant left lower quadrant.  No rebound or guarding. His heart and lungs are clear.  Does have history of inguinal hernia however states this is chronic, no increase in size in his testicles or baseline.  No pain.  We will plan on labs, imaging and reassess.  Labs and imaging personally reviewed and interpreted:  CBC leukocytosis Metabolic panel UA negative for infection Lipase 39 C. Difficile negative GI panel pending CT [facility] colitis, infectious versus inflammatory EKG without STEMI  Patient reassessed.  Pain controlled.  CONSULT with Dr. Laural Golden with GI, recommends Imodium, Cipro, Flagyl, pain control and he will follow-up in office.  I discussed this with patient.  Given dose of medications here in ED.  Patient does not meet the SIRS or Sepsis criteria.  On repeat exam patient does not have a surgical abdomin and there are no peritoneal signs.  No indication of appendicitis, bowel obstruction, bowel perforation,  cholecystitis.  Discussed plan with change in medications here in ED.  Patient is agreeable to this.  He will follow outpatient with GI.  He will return for any worsening symptoms  The patient has been appropriately medically screened and/or stabilized in the ED. I have low suspicion for any other emergent medical condition which would require further screening, evaluation or treatment in the ED or require inpatient management.  Patient is hemodynamically stable and in no acute distress.  Patient able to ambulate in department prior to ED.  Evaluation does not show acute pathology that would require ongoing or additional emergent interventions while in the emergency department or further inpatient treatment.  I have discussed the diagnosis with the patient and answered all questions.  Pain is been managed while in the emergency department and patient has no further complaints prior to discharge.  Patient is comfortable with plan discussed in room and is stable for discharge at this time.  I have discussed strict return precautions for returning to the emergency department.  Patient was encouraged to follow-up with PCP/specialist refer to at discharge.     Patient seen eval by attending, Dr. Sedonia Small who agrees above treatment, plan is position    MDM Rules/Calculators/A&P                           Final Clinical Impression(s) / ED Diagnoses Final diagnoses:  Colitis    Rx / DC Orders ED Discharge Orders         Ordered    ciprofloxacin (CIPRO) 500 MG tablet  Every 12 hours        07/05/20 2130    metroNIDAZOLE (FLAGYL) 500 MG tablet  2 times daily        07/05/20 2130    HYDROcodone-acetaminophen (NORCO/VICODIN) 5-325 MG tablet  Every 4 hours PRN        07/05/20 2131    loperamide (IMODIUM) 2 MG capsule  4 times daily PRN        07/05/20 2137           Mahala Rommel A, PA-C 07/05/20 2138    Maudie Flakes, MD 07/05/20 2336

## 2020-07-05 NOTE — ED Triage Notes (Signed)
Diarrhea for 2 weeks

## 2020-07-07 LAB — GASTROINTESTINAL PANEL BY PCR, STOOL (REPLACES STOOL CULTURE)

## 2020-07-07 MED FILL — Hydrocodone-Acetaminophen Tab 5-325 MG: ORAL | Qty: 6 | Status: AC

## 2020-07-08 ENCOUNTER — Telehealth (INDEPENDENT_AMBULATORY_CARE_PROVIDER_SITE_OTHER): Payer: Self-pay | Admitting: Internal Medicine

## 2020-07-08 NOTE — Telephone Encounter (Signed)
GI pathogen panel and C. difficile stool antigen and toxin negative. Results given to patient. He says diarrhea is better but not completely resolved. He is not interested in office visit or diagnostic colonoscopy at this time. I advised patient to call us with progress report either this Friday or next week Monday. He was given our office number.

## 2020-07-10 ENCOUNTER — Other Ambulatory Visit (INDEPENDENT_AMBULATORY_CARE_PROVIDER_SITE_OTHER): Payer: Self-pay

## 2020-07-10 ENCOUNTER — Telehealth (INDEPENDENT_AMBULATORY_CARE_PROVIDER_SITE_OTHER): Payer: Self-pay | Admitting: Internal Medicine

## 2020-07-10 MED ORDER — LOPERAMIDE HCL 2 MG PO CAPS: ORAL_CAPSULE | ORAL | 1 refills | Status: DC

## 2020-07-10 NOTE — Telephone Encounter (Signed)
Patient called the office stated he is still taking the 2 antibiotics - he is still having diarrhea - states he is so weak  - he just needs some advice - ph# [phone]

## 2020-07-10 NOTE — Telephone Encounter (Signed)
I spoke with the patient he states he is still having diarrhea, and is taking Metronidazole 500 mg bid and Cipro 500 mg one po Q 12 hours. He had also been taking Loperamide 2 mg @ po QID prn and has used them all. He states he feels weak and tired, no nausea, no vomiting, no fevers,no dark or bloody stools noticed. He states he does have lower abdominal pain after eating at time.

## 2020-07-10 NOTE — Telephone Encounter (Signed)
Per Dr. Karilyn Cota send in Lopermide 2 mg take one po up to TID prn # 60 with 1 refill. Patient aware and wanted sent to [facility] on [address]. Medication sent to the requested pharmacy and the patient is aware of all.

## 2020-09-26 ENCOUNTER — Other Ambulatory Visit: Payer: PPO

## 2020-09-26 ENCOUNTER — Other Ambulatory Visit: Payer: Self-pay

## 2020-09-26 DIAGNOSIS — C61 Malignant neoplasm of prostate: Secondary | ICD-10-CM | POA: Diagnosis not present

## 2020-09-27 LAB — PSA: Prostate Specific Ag, Serum: <0.1 ng/mL (ref 0.0–4.0)

## 2020-10-03 ENCOUNTER — Ambulatory Visit: Payer: PPO | Admitting: Urology

## 2020-10-13 DIAGNOSIS — F419 Anxiety disorder, unspecified: Secondary | ICD-10-CM | POA: Diagnosis not present

## 2020-10-14 NOTE — Progress Notes (Signed)
Sent via mail 

## 2020-11-14 ENCOUNTER — Other Ambulatory Visit: Payer: Self-pay

## 2020-11-14 ENCOUNTER — Ambulatory Visit: Payer: PPO | Admitting: Urology

## 2020-11-14 ENCOUNTER — Encounter: Payer: Self-pay | Admitting: Urology

## 2020-11-14 VITALS — BP 158/73 | HR 87 | Temp 97.8°F | Ht 69.0 in | Wt 160.0 lb

## 2020-11-14 DIAGNOSIS — C61 Malignant neoplasm of prostate: Secondary | ICD-10-CM

## 2020-11-14 DIAGNOSIS — N5201 Erectile dysfunction due to arterial insufficiency: Secondary | ICD-10-CM | POA: Diagnosis not present

## 2020-11-14 DIAGNOSIS — N138 Other obstructive and reflux uropathy: Secondary | ICD-10-CM | POA: Diagnosis not present

## 2020-11-14 DIAGNOSIS — E291 Testicular hypofunction: Secondary | ICD-10-CM

## 2020-11-14 DIAGNOSIS — R351 Nocturia: Secondary | ICD-10-CM | POA: Diagnosis not present

## 2020-11-14 DIAGNOSIS — N401 Enlarged prostate with lower urinary tract symptoms: Secondary | ICD-10-CM

## 2020-11-14 MED ORDER — AMBULATORY NON FORMULARY MEDICATION: 0.2000 mL | 5 refills | Status: AC | PRN

## 2020-11-14 MED ORDER — AMBULATORY NON FORMULARY MEDICATION: 0.2000 mL | 5 refills | Status: DC | PRN

## 2020-11-14 NOTE — Patient Instructions (Signed)
Hydrocelectomy, Adult  A hydrocelectomy is a surgical procedure to remove a collection of fluid (hydrocele) from the scrotum, which is the pouch that holds the testicles. You may need to have this procedure if a hydrocele is causing painful swelling in your scrotum. Tell a health care provider about:  Any allergies you have.  All medicines you are taking, including vitamins, herbs, eye drops, creams, and over-the-counter medicines.  Any problems you or family members have had with anesthetic medicines.  Any blood disorders you have.  Any surgeries you have had.  Any medical conditions you have. What are the risks? Generally, this is a safe procedure. However, problems may occur, including:  Bleeding into the scrotum (scrotal hematoma).  Damage to nearby structures or organs, including to the testicle or the tube that carries sperm out of the testicle (vas deferens).  Infection.  Allergic reactions to medicines. What happens before the procedure? Staying hydrated Follow instructions from your health care provider about hydration, which may include:  Up to 2 hours before the procedure - you may continue to drink clear liquids, such as water, clear fruit juice, black coffee, and plain tea. Eating and drinking restrictions Follow instructions from your health care provider about eating and drinking, which may include:  8 hours before the procedure - stop eating heavy meals or foods, such as meat, fried foods, or fatty foods.  6 hours before the procedure - stop eating light meals or foods, such as toast or cereal.  6 hours before the procedure - stop drinking milk or drinks that contain milk.  2 hours before the procedure - stop drinking clear liquids. Medicines Ask your health care provider about:  Changing or stopping your regular medicines. This is especially important if you are taking diabetes medicines or blood thinners.  Taking medicines such as aspirin and ibuprofen.  These medicines can thin your blood. Do not take these medicines unless your health care provider tells you to take them.  Taking over-the-counter medicines, vitamins, herbs, and supplements. General instructions  Do not use any products that contain nicotine or tobacco for at least 4 weeks before the procedure. These products include cigarettes, e-cigarettes, and chewing tobacco. If you need help quitting, ask your health care provider.  Plan to have someone take you home from the hospital or clinic.  Plan to have a responsible adult care for you for at least 24 hours after you leave the hospital or clinic. This is important.  Ask your health care provider: ? How your surgery site will be marked. ? What steps will be taken to help prevent infection. These may include:  Removing hair at the surgery site.  Washing skin with a germ-killing soap.  Taking antibiotic medicine. What happens during the procedure?  An IV will be inserted into one of your veins.  You will be given one or more of the following: ? A medicine to make you relax (sedative). ? A medicine to make you fall asleep (general anesthetic).  A small incision will be made through the skin of your scrotum.  Your testicle and the hydrocele will be located, and the hydrocele sac will be opened with an incision.  The fluid will be drained from the hydrocele. Part of the hydrocele sac may be removed.  The hydrocele will be closed with stitches that dissolve (absorbable sutures). This prevents fluid from building up again.  If your hydrocele is large, you may have a thin, rubber drain placed to allow fluid to drain   after the procedure.  The incision in your scrotum will be closed with absorbable sutures, skin glue, or adhesives.  A bandage (dressing) will be placed over the incision. The dressing may be held in place with an athletic support strap (scrotal support). The procedure may vary among health care providers and  hospitals. What happens after the procedure?  Your blood pressure, heart rate, breathing rate, and blood oxygen level will be monitored until you leave the hospital or clinic.  You will be given pain medicine as needed.  Your IV will be removed, and your insertion site will be checked for bleeding.  Do not drive for 24 hours if you were given a sedative during your procedure.  You may need to wear a scrotal support. This holds the dressing in place and supports your scrotum.   Summary  A hydrocelectomy is a surgical procedure to remove a collection of fluid (hydrocele) from the scrotum, which is the pouch that holds the testicles. You may need to have this procedure if a hydrocele is causing painful swelling in your scrotum.  During the procedure, the hydrocele will be drained and then closed with stitches that dissolve (absorbable sutures). This prevents fluid from building up again.  If your hydrocele is large, you may have a thin, rubber drain placed to allow fluid to drain after the procedure.  You may need to wear a scrotal support after your procedure. This holds the dressing in place and supports your scrotum. This information is not intended to replace advice given to you by your health care provider. Make sure you discuss any questions you have with your health care provider. Document Revised: 11/21/2018 Document Reviewed: 11/21/2018 Elsevier Patient Education  2021 [employer]  

## 2020-11-14 NOTE — Progress Notes (Signed)

## 2020-11-14 NOTE — Progress Notes (Signed)
11/14/2020 9:10 AM   Arlyss Repress 07/11/1951 [id number]  Referring provider: Redmond School, MD [address] [place],  [place] [address]  Followup Prostate cancer  HPI: Mr Tallarico is a 70yo here for followuop for prostate cancer, erectile dysfunction, and nocturia. PSA undetectable. He continues to have issues getting and maintaining an erection. Her has failed tadalafil 5-20mg . He is inquiring about trimix injection. He has has stable Mild LUTS off BPH therapy. IPSS 2 QOL 1. He has intermittent left groin pain and worsening scrotal swelling. He has pain after 107minutes of riding his motorcycle.      PMH: Past Medical History:  Diagnosis Date  . Anxiety   . Arthritis   . Hepatitis    hepatitis a as child  . Hypercholesteremia   . Macular degeneration    left eye  . Prostate cancer ([place])   . Tobacco abuse     Surgical History: Past Surgical History:  Procedure Laterality Date  . APPENDECTOMY    . CYSTOSCOPY  04/28/2018   Procedure: CYSTOSCOPY FLEXIBLE;  Surgeon: Cleon Gustin, MD;  Location: [facility];  Service: Urology;;  no seeds found in bladder  . EYE SURGERY  10-15 yrs ago   ioc for cataracts  . PROSTATE BIOPSY N/A 01/09/2018   Procedure: BIOPSY TRANSRECTAL ULTRASONIC PROSTATE (TUBP);  Surgeon: Cleon Gustin, MD;  Location: [facility];  Service: Urology;  Laterality: N/A;  30 MINS  . RADIOACTIVE SEED IMPLANT N/A 04/28/2018   Procedure: RADIOACTIVE SEED IMPLANT/BRACHYTHERAPY IMPLANT;  Surgeon: Cleon Gustin, MD;  Location[facility];  Service: Urology;  Laterality: N/A;   78 seeds implanted  . removal of cyst     back of head  . SPACE OAR INSTILLATION N/A 04/28/2018   Procedure: SPACE OAR INSTILLATION;  Surgeon: Cleon Gustin, MD;  Location: [facility];  Service: Urology;  Laterality: N/A;    Home Medications:  Allergies as of 11/14/2020      Reactions   Codeine Other (See Comments)    Headache dizziness      Medication List       Accurate as of Nov 14, 2020  9:10 AM. If you have any questions, ask your nurse or doctor.        STOP taking these medications   atorvastatin 20 MG tablet Commonly known as: LIPITOR Stopped by: Nicolette Bang, MD   ciprofloxacin 500 MG tablet Commonly known as: CIPRO Stopped by: Nicolette Bang, MD   CoQ10 100 MG Caps Stopped by: Nicolette Bang, MD   diphenoxylate-atropine 2.5-0.025 MG tablet Commonly known as: LOMOTIL Stopped by: Nicolette Bang, MD   HYDROcodone-acetaminophen 5-325 MG tablet Commonly known as: NORCO/VICODIN Stopped by: Nicolette Bang, MD   ipratropium 0.06 % nasal spray Commonly known as: ATROVENT Stopped by: Nicolette Bang, MD   loperamide 2 MG capsule Commonly known as: IMODIUM Stopped by: Nicolette Bang, MD   meloxicam 7.5 MG tablet Commonly known as: MOBIC Stopped by: Nicolette Bang, MD   metroNIDAZOLE 500 MG tablet Commonly known as: FLAGYL Stopped by: Nicolette Bang, MD   tadalafil 20 MG tablet Commonly known as: Cialis Stopped by: Nicolette Bang, MD   tamsulosin 0.4 MG Caps capsule Commonly known as: FLOMAX Stopped by: Nicolette Bang, MD     TAKE these medications   ALPRAZolam 0.5 MG tablet Commonly known as: XANAX Take 1 tablet (0.5 mg total) by mouth 3 (three) times daily as needed for anxiety or sleep.   Ciprodex OTIC  suspension Generic drug: ciprofloxacin-dexamethasone PLACE 4 DROPS INTO RIGHT EAR BID FOR 14 DAYS   clotrimazole-betamethasone cream Commonly known as: LOTRISONE Apply topically 2 (two) times daily.   ibuprofen 800 MG tablet Commonly known as: ADVIL On hold       Allergies:  Allergies  Allergen Reactions  . Codeine Other (See Comments)    Headache dizziness    Family History: Family History  Problem Relation Age of Onset  . Lung cancer Father   . Prostate cancer Neg Hx   . Colon cancer Neg Hx   . Pancreatic cancer Neg Hx    . Breast cancer Neg Hx     Social History:  reports that he has been smoking cigarettes. He has a 60.00 pack-year smoking history. He has never used smokeless tobacco. He reports current alcohol use. He reports that he does not use drugs.  ROS: All other review of systems were reviewed and are negative except what is noted above in HPI  Physical Exam: BP (!) 158/73   Pulse 87   Temp 97.8 F (36.6 C)   Ht 5\' 9"  (1.753 m)   Wt 160 lb (72.6 kg)   BMI 23.63 kg/m   Constitutional:  Alert and oriented, No acute distress. HEENT: [place] AT, moist mucus membranes.  Trachea midline, no masses. Cardiovascular: No clubbing, cyanosis, or edema. Respiratory: Normal respiratory effort, no increased work of breathing. GI: Abdomen is soft, nontender, nondistended, no abdominal masses GU: No CVA tenderness.  7cm left hydrocele. Circumcised phallus. No other masses/lesions on testis, scrotum, epididymis. Lymph: No cervical or inguinal lymphadenopathy. Skin: No rashes, bruises or suspicious lesions. Neurologic: Grossly intact, no focal deficits, moving all 4 extremities. Psychiatric: Normal mood and affect.  Laboratory Data: Lab Results  Component Value Date   WBC 10.5 07/05/2020   HGB 14.1 07/05/2020   HCT 42.9 07/05/2020   MCV 96.8 07/05/2020   PLT 309 07/05/2020    Lab Results  Component Value Date   CREATININE 0.91 07/05/2020    Lab Results  Component Value Date   PSA 0.2 08/09/2019    No results found for: TESTOSTERONE  Lab Results  Component Value Date   HGBA1C 5.4 12/31/2012    Urinalysis    Component Value Date/Time   COLORURINE YELLOW 07/05/2020 1544   APPEARANCEUR CLEAR 07/05/2020 1544   APPEARANCEUR Clear 04/02/2020 1114   LABSPEC 1.013 07/05/2020 1544   PHURINE 6.0 07/05/2020 1544   GLUCOSEU NEGATIVE 07/05/2020 1544   HGBUR NEGATIVE 07/05/2020 [place] 07/05/2020 1544   BILIRUBINUR Negative 04/02/2020 1114   KETONESUR 5 (A) 07/05/2020 1544    PROTEINUR NEGATIVE 07/05/2020 1544   NITRITE NEGATIVE 07/05/2020 1544   LEUKOCYTESUR NEGATIVE 07/05/2020 1544    Lab Results  Component Value Date   LABMICR Comment 04/02/2020   BACTERIA NONE SEEN 12/01/2017    Pertinent Imaging:  No results found for this or any previous visit.  No results found for this or any previous visit.  No results found for this or any previous visit.  No results found for this or any previous visit.  No results found for this or any previous visit.  No results found for this or any previous visit.  No results found for this or any previous visit.  No results found for this or any previous visit.   Assessment & Plan:    1. Prostate cancer ([place]) -RTC 6 months with [facility] - Urinalysis, Routine w reflex microscopic  2. Erectile dysfunction due  to arterial insufficiency -We will trial trimix injection  3. Benign prostatic hyperplasia with urinary obstruction -observation  4. Nocturia -fluid management prior to going to bed   No follow-ups on file.  Nicolette Bang, MD  [facility] [place]

## 2020-11-15 LAB — URINALYSIS, ROUTINE W REFLEX MICROSCOPIC
Bilirubin, UA: NEGATIVE
Glucose, UA: NEGATIVE
Ketones, UA: NEGATIVE
Leukocytes,UA: NEGATIVE
Nitrite, UA: NEGATIVE
Protein,UA: NEGATIVE
RBC, UA: NEGATIVE
Specific Gravity, UA: 1.015 (ref 1.005–1.030)
Urobilinogen, Ur: 0.2 mg/dL (ref 0.2–1.0)
pH, UA: 7 (ref 5.0–7.5)

## 2020-11-17 LAB — TESTOSTERONE,FREE AND TOTAL
Testosterone, Free: 5.2 pg/mL — ABNORMAL LOW (ref 6.6–18.1)
Testosterone: 602 ng/dL (ref 264–916)

## 2020-11-17 LAB — PSA: Prostate Specific Ag, Serum: <0.1 ng/mL (ref 0.0–4.0)

## 2020-11-21 ENCOUNTER — Telehealth: Payer: Self-pay

## 2020-11-21 NOTE — Telephone Encounter (Signed)
-----   Message from Cleon Gustin, MD sent at 11/20/2020  8:03 AM EDT ----- normal ----- Message ----- From: Dorisann Frames, RN Sent: 11/17/2020   4:28 PM EDT To: Cleon Gustin, MD  Please review

## 2020-11-21 NOTE — Telephone Encounter (Signed)
Patient notified of results.

## 2020-11-28 ENCOUNTER — Ambulatory Visit: Payer: PPO | Admitting: Urology

## 2020-11-28 DIAGNOSIS — Z1389 Encounter for screening for other disorder: Secondary | ICD-10-CM | POA: Diagnosis not present

## 2020-11-28 DIAGNOSIS — Z13220 Encounter for screening for lipoid disorders: Secondary | ICD-10-CM | POA: Diagnosis not present

## 2020-11-28 DIAGNOSIS — C61 Malignant neoplasm of prostate: Secondary | ICD-10-CM | POA: Diagnosis not present

## 2020-11-28 DIAGNOSIS — Z1331 Encounter for screening for depression: Secondary | ICD-10-CM | POA: Diagnosis not present

## 2020-11-28 DIAGNOSIS — Z0000 Encounter for general adult medical examination without abnormal findings: Secondary | ICD-10-CM | POA: Diagnosis not present

## 2020-11-28 DIAGNOSIS — Z6822 Body mass index (BMI) 22.0-22.9, adult: Secondary | ICD-10-CM | POA: Diagnosis not present

## 2020-11-28 DIAGNOSIS — N400 Enlarged prostate without lower urinary tract symptoms: Secondary | ICD-10-CM | POA: Diagnosis not present

## 2020-11-28 DIAGNOSIS — I700 Atherosclerosis of aorta: Secondary | ICD-10-CM | POA: Diagnosis not present

## 2020-11-28 DIAGNOSIS — F419 Anxiety disorder, unspecified: Secondary | ICD-10-CM | POA: Diagnosis not present

## 2020-12-19 NOTE — Patient Instructions (Signed)
David White  12/19/2020     @PREFPERIOPPHARMACY @   Your procedure is scheduled on  12/25/2020.   Report to [name] at  [email or web address]   Call this number if you have problems the morning of surgery:  [phone]   Remember:  Do not eat or drink after midnight.    Take these medicines the morning of surgery with A SIP OF WATER   Xanax (if needed).       Please brush your teeth.  Do not wear jewelry, make-up or nail polish.  Do not wear lotions, powders, or perfumes, or deodorant.  Do not shave 48 hours prior to surgery.  Men may shave face and neck.  Do not bring valuables to the hospital.  [facility] is not responsible for any belongings or valuables.  Contacts, dentures or bridgework may not be worn into surgery.  Leave your suitcase in the car.  After surgery it may be brought to your room.  For patients admitted to the hospital, discharge time will be determined by your treatment team.  Patients discharged the day of surgery will not be allowed to drive home and must have someone with them for 24 hours.    Special instructions:  DO NOT smoke tobacco or vape for 24 hours before your procedure.  Please read over the following fact sheets that you were given. Coughing and Deep Breathing, Surgical Site Infection Prevention, Anesthesia Post-op Instructions, and Care and Recovery After Surgery      Current Urology, 10(1), 1-14. [email or web address]">  Hydrocelectomy, Adult, Care After This sheet gives you information about how to care for yourself after your procedure. Your health care provider may also give you more specific instructions. If you have problems or questions, contact your health careprovider. What can I expect after the procedure? After your procedure, it is common to have: Mild discomfort and swelling in the pouch that holds your testicles (scrotum). Bruising of the scrotum. Follow these instructions at home: Medicines Take  over-the-counter and prescription medicines only as told by your health care provider. Ask your health care provider if the medicine prescribed to you: Requires you to avoid driving or using heavy machinery. Can cause constipation. You may need to take these actions to prevent or treat constipation: Drink enough fluid to keep your urine pale yellow. Take over-the-counter or prescription medicines. Eat foods that are high in fiber, such as beans, whole grains, and fresh fruits and vegetables. Limit foods that are high in fat and processed sugars, such as fried or sweet foods. Bathing Do not take baths, swim, or use a hot tub until your health care provider approves. Ask your health care provider if you may take showers. You may only be allowed to take sponge baths. If you were told to wear an athletic support strap (scrotal support), keep it dry. Take it off when you shower or bathe. Incision care  Follow instructions from your health care provider about how to take care of your incision. Make sure you: Wash your hands with soap and water before and after you change your bandage (dressing). If soap and water are not available, use hand sanitizer. Change your dressing as told by your health care provider. Leave stitches (sutures), skin glue, or adhesive strips in place. These skin closures may need to stay in place for 2 weeks or longer. If adhesive strip edges start to loosen and curl up, you may trim the loose edges. Do not remove  adhesive strips completely unless your health care provider tells you to do that. Check your incision and scrotum every day for signs of infection. Check for: More redness, swelling, or pain. Fluid or blood. Warmth. Pus or a bad smell.  Managing pain and swelling If directed, put ice on the affected area. To do this: Put ice in a plastic bag. Place a towel between your skin and the bag. Leave the ice on for 20 minutes, 2-3 times per day.  Activity Do not do  any high-energy activities for as long as told by your health care provider. Do not lift anything that is heavier than 10 lb (4.5 kg), or the limit that you are told, until your health care provider says that it is safe. Return to your normal activities as told by your health care provider. Ask your health care provider what activities are safe for you. Do not drive for 24 hours if you were given a sedative during your procedure. Ask your health care provider when it is safe to drive. General instructions Do not use any products that contain nicotine or tobacco, such as cigarettes, e-cigarettes, and chewing tobacco. These can delay incision healing after surgery. If you need help quitting, ask your health care provider. If you were given a scrotal support, wear it as told by your health care provider. If you had a drain put in during the procedure, you will need to have it removed at a follow-up visit. Keep all follow-up visits as told by your health care provider. This is important. Contact a health care provider if: Your pain is not controlled with medicine. You have more redness, swelling, or pain around your scrotum. You have fluid or blood coming from your incision. Your incision feels warm to the touch. You have pus or a bad smell coming from your scrotum. You have a fever. Get help right away if: You develop shaking, chills, and a fever that is higher than 101.89F (38.8C). You have redness or swelling that starts at your scrotum and spreads outward to cover your whole groin. You develop swelling of the legs or difficulty breathing. Summary After a hydrocelectomy, it is common to have mild discomfort, swelling, and bruising. Do not take baths, swim, or use a hot tub until your health care provider approves. Ask your health care provider if you may take showers. If directed, put ice on the affected area to help with pain and swelling. Do not do any high-energy activities or lift  anything heavier than 10 lb (4.5 kg) for as long as told by your health care provider. If you were given a scrotal support, keep it dry. Wear the scrotal support as told by your health care provider. This information is not intended to replace advice given to you by your health care provider. Make sure you discuss any questions you have with your healthcare provider. Document Revised: 11/21/2018 Document Reviewed: 11/21/2018 Elsevier Patient Education  2022 [place] Revised: 01/11/2019 Document Reviewed: 01/11/2019 Elsevier Patient Education  2022 [place] Anesthesia, Adult, Care After This sheet gives you information about how to care for yourself after your procedure. Your health care provider may also give you more specific instructions. If you have problems or questions, contact your health careprovider. What can I expect after the procedure? After the procedure, the following side effects are common: Pain or discomfort at the IV site. Nausea. Vomiting. Sore throat. Trouble concentrating. Feeling cold or chills. Feeling weak or tired. Sleepiness and  fatigue. Soreness and body aches. These side effects can affect parts of the body that were not involved in surgery. Follow these instructions at home: For the time period you were told by your health care provider:  Rest. Do not participate in activities where you could fall or become injured. Do not drive or use machinery. Do not drink alcohol. Do not take sleeping pills or medicines that cause drowsiness. Do not make important decisions or sign legal documents. Do not take care of children on your own.  Eating and drinking Follow any instructions from your health care provider about eating or drinking restrictions. When you feel hungry, start by eating small amounts of foods that are soft and easy to digest (bland), such as toast. Gradually return to your regular diet. Drink enough fluid to keep your  urine pale yellow. If you vomit, rehydrate by drinking water, juice, or clear broth. General instructions If you have sleep apnea, surgery and certain medicines can increase your risk for breathing problems. Follow instructions from your health care provider about wearing your sleep device: Anytime you are sleeping, including during daytime naps. While taking prescription pain medicines, sleeping medicines, or medicines that make you drowsy. Have a responsible adult stay with you for the time you are told. It is important to have someone help care for you until you are awake and alert. Return to your normal activities as told by your health care provider. Ask your health care provider what activities are safe for you. Take over-the-counter and prescription medicines only as told by your health care provider. If you smoke, do not smoke without supervision. Keep all follow-up visits as told by your health care provider. This is important. Contact a health care provider if: You have nausea or vomiting that does not get better with medicine. You cannot eat or drink without vomiting. You have pain that does not get better with medicine. You are unable to pass urine. You develop a skin rash. You have a fever. You have redness around your IV site that gets worse. Get help right away if: You have difficulty breathing. You have chest pain. You have blood in your urine or stool, or you vomit blood. Summary After the procedure, it is common to have a sore throat or nausea. It is also common to feel tired. Have a responsible adult stay with you for the time you are told. It is important to have someone help care for you until you are awake and alert. When you feel hungry, start by eating small amounts of foods that are soft and easy to digest (bland), such as toast. Gradually return to your regular diet. Drink enough fluid to keep your urine pale yellow. Return to your normal activities as told by  your health care provider. Ask your health care provider what activities are safe for you. This information is not intended to replace advice given to you by your health care provider. Make sure you discuss any questions you have with your healthcare provider. Document Revised: 03/13/2020 Document Reviewed: 10/11/2019 Elsevier Patient Education  2022 [place]. How to Use Chlorhexidine for Bathing Chlorhexidine gluconate (CHG) is a germ-killing (antiseptic) solution that is used to clean the skin. It can get rid of the bacteria that normally live on the skin and can keep them away for about 24 hours. To clean your skin with CHG, you may be given: A CHG solution to use in the shower or as part of a sponge bath. A prepackaged  cloth that contains CHG. Cleaning your skin with CHG may help lower the risk for infection: While you are staying in the intensive care unit of the hospital. If you have a vascular access, such as a central line, to provide short-term or long-term access to your veins. If you have a catheter to drain urine from your bladder. If you are on a ventilator. A ventilator is a machine that helps you breathe by moving air in and out of your lungs. After surgery. What are the risks? Risks of using CHG include: A skin reaction. Hearing loss, if CHG gets in your ears. Eye injury, if CHG gets in your eyes and is not rinsed out. The CHG product catching fire. Make sure that you avoid smoking and flames after applying CHG to your skin. Do not use CHG: If you have a chlorhexidine allergy or have previously reacted to chlorhexidine. On babies younger than 36 months of age. How to use CHG solution Use CHG only as told by your health care provider, and follow the instructions on the label. Use the full amount of CHG as directed. Usually, this is one bottle. During a shower Follow these steps when using CHG solution during a shower (unless your health care provider gives you different  instructions): Start the shower. Use your normal soap and shampoo to wash your face and hair. Turn off the shower or move out of the shower stream. Pour the CHG onto a clean washcloth. Do not use any type of brush or rough-edged sponge. Starting at your neck, lather your body down to your toes. Make sure you follow these instructions: If you will be having surgery, pay special attention to the part of your body where you will be having surgery. Scrub this area for at least 1 minute. Do not use CHG on your head or face. If the solution gets into your ears or eyes, rinse them well with water. Avoid your genital area. Avoid any areas of skin that have broken skin, cuts, or scrapes. Scrub your back and under your arms. Make sure to wash skin folds. Let the lather sit on your skin for 1-2 minutes or as long as told by your health care provider. Thoroughly rinse your entire body in the shower. Make sure that all body creases and crevices are rinsed well. Dry off with a clean towel. Do not put any substances on your body afterward--such as powder, lotion, or perfume--unless you are told to do so by your health care provider. Only use lotions that are recommended by the manufacturer. Put on clean clothes or pajamas. If it is the night before your surgery, sleep in clean sheets.  During a sponge bath Follow these steps when using CHG solution during a sponge bath (unless your health care provider gives you different instructions): Use your normal soap and shampoo to wash your face and hair. Pour the CHG onto a clean washcloth. Starting at your neck, lather your body down to your toes. Make sure you follow these instructions: If you will be having surgery, pay special attention to the part of your body where you will be having surgery. Scrub this area for at least 1 minute. Do not use CHG on your head or face. If the solution gets into your ears or eyes, rinse them well with water. Avoid your genital  area. Avoid any areas of skin that have broken skin, cuts, or scrapes. Scrub your back and under your arms. Make sure to wash skin folds.  Let the lather sit on your skin for 1-2 minutes or as long as told by your health care provider. Using a different clean, wet washcloth, thoroughly rinse your entire body. Make sure that all body creases and crevices are rinsed well. Dry off with a clean towel. Do not put any substances on your body afterward--such as powder, lotion, or perfume--unless you are told to do so by your health care provider. Only use lotions that are recommended by the manufacturer. Put on clean clothes or pajamas. If it is the night before your surgery, sleep in clean sheets. How to use CHG prepackaged cloths Only use CHG cloths as told by your health care provider, and follow the instructions on the label. Use the CHG cloth on clean, dry skin. Do not use the CHG cloth on your head or face unless your health care provider tells you to. When washing with the CHG cloth: Avoid your genital area. Avoid any areas of skin that have broken skin, cuts, or scrapes. Before surgery Follow these steps when using a CHG cloth to clean before surgery (unless your health care provider gives you different instructions): Using the CHG cloth, vigorously scrub the part of your body where you will be having surgery. Scrub using a back-and-forth motion for 3 minutes. The area on your body should be completely wet with CHG when you are done scrubbing. Do not rinse. Discard the cloth and let the area air-dry. Do not put any substances on the area afterward, such as powder, lotion, or perfume. Put on clean clothes or pajamas. If it is the night before your surgery, sleep in clean sheets.  For general bathing Follow these steps when using CHG cloths for general bathing (unless your health care provider gives you different instructions). Use a separate CHG cloth for each area of your body. Make sure you  wash between any folds of skin and between your fingers and toes. Wash your body in the following order, switching to a new cloth after each step: The front of your neck, shoulders, and chest. Both of your arms, under your arms, and your hands. Your stomach and groin area, avoiding the genitals. Your right leg and foot. Your left leg and foot. The back of your neck, your back, and your buttocks. Do not rinse. Discard the cloth and let the area air-dry. Do not put any substances on your body afterward--such as powder, lotion, or perfume--unless you are told to do so by your health care provider. Only use lotions that are recommended by the manufacturer. Put on clean clothes or pajamas. Contact a health care provider if: Your skin gets irritated after scrubbing. You have questions about using your solution or cloth. Get help right away if: Your eyes become very red or swollen. Your eyes itch badly. Your skin itches badly and is red or swollen. Your hearing changes. You have trouble seeing. You have swelling or tingling in your mouth or throat. You have trouble breathing. You swallow any chlorhexidine. Summary Chlorhexidine gluconate (CHG) is a germ-killing (antiseptic) solution that is used to clean the skin. Cleaning your skin with CHG may help to lower your risk for infection. You may be given CHG to use for bathing. It may be in a bottle or in a prepackaged cloth to use on your skin. Carefully follow your health care provider's instructions and the instructions on the product label. Do not use CHG if you have a chlorhexidine allergy. Contact your health care provider if your skin  gets irritated after scrubbing. This information is not intended to replace advice given to you by your health care provider. Make sure you discuss any questions you have with your healthcare provider. Document Revised: 11/09/2019 Document Reviewed: 12/14/2019 Elsevier Patient Education  [place].

## 2020-12-23 ENCOUNTER — Other Ambulatory Visit (HOSPITAL_COMMUNITY): Payer: PPO | Attending: Urology

## 2020-12-23 ENCOUNTER — Encounter (HOSPITAL_COMMUNITY): Payer: Self-pay

## 2020-12-23 ENCOUNTER — Encounter (HOSPITAL_COMMUNITY)
Admission: RE | Admit: 2020-12-23 | Discharge: 2020-12-23 | Disposition: A | Discharge status: Home / Self Care | Payer: PPO | Source: Ambulatory Visit | Attending: Urology | Admitting: Urology

## 2020-12-23 ENCOUNTER — Other Ambulatory Visit: Payer: Self-pay

## 2020-12-23 DIAGNOSIS — Z01812 Encounter for preprocedural laboratory examination: Secondary | ICD-10-CM | POA: Diagnosis not present

## 2020-12-23 LAB — CBC WITH DIFFERENTIAL/PLATELET
Abs Immature Granulocytes: 0.06 10*3/uL (ref 0.00–0.07)
Basophils Absolute: 0 10*3/uL (ref 0.0–0.1)
Basophils Relative: 0 %
Eosinophils Absolute: 0.6 10*3/uL — ABNORMAL HIGH (ref 0.0–0.5)
Eosinophils Relative: 7 %
HCT: 43.8 % (ref 39.0–52.0)
Hemoglobin: 14.5 g/dL (ref 13.0–17.0)
Immature Granulocytes: 1 %
Lymphocytes Relative: 25 %
Lymphs Abs: 2.3 10*3/uL (ref 0.7–4.0)
MCH: 32.2 pg (ref 26.0–34.0)
MCHC: 33.1 g/dL (ref 30.0–36.0)
MCV: 97.3 fL (ref 80.0–100.0)
Monocytes Absolute: 0.7 10*3/uL (ref 0.1–1.0)
Monocytes Relative: 8 %
Neutro Abs: 5.4 10*3/uL (ref 1.7–7.7)
Neutrophils Relative %: 59 %
Platelets: 250 10*3/uL (ref 150–400)
RBC: 4.5 MIL/uL (ref 4.22–5.81)
RDW: 13.1 % (ref 11.5–15.5)
WBC: 9.2 10*3/uL (ref 4.0–10.5)
nRBC: 0 % (ref 0.0–0.2)

## 2020-12-25 ENCOUNTER — Ambulatory Visit (HOSPITAL_COMMUNITY)
Admission: RE | Admit: 2020-12-25 | Discharge: 2020-12-25 | Disposition: A | Discharge status: Home / Self Care | Payer: PPO | Attending: Urology | Admitting: Urology

## 2020-12-25 ENCOUNTER — Ambulatory Visit (HOSPITAL_COMMUNITY): Payer: PPO | Admitting: Certified Registered"

## 2020-12-25 ENCOUNTER — Encounter (HOSPITAL_COMMUNITY)
Admission: RE | Disposition: A | Discharge status: Home / Self Care | Payer: Self-pay | Source: Home / Self Care | Attending: Urology

## 2020-12-25 ENCOUNTER — Encounter (HOSPITAL_COMMUNITY): Payer: Self-pay | Admitting: Urology

## 2020-12-25 DIAGNOSIS — Z801 Family history of malignant neoplasm of trachea, bronchus and lung: Secondary | ICD-10-CM | POA: Insufficient documentation

## 2020-12-25 DIAGNOSIS — N503 Cyst of epididymis: Secondary | ICD-10-CM | POA: Insufficient documentation

## 2020-12-25 DIAGNOSIS — F17210 Nicotine dependence, cigarettes, uncomplicated: Secondary | ICD-10-CM | POA: Insufficient documentation

## 2020-12-25 DIAGNOSIS — Z8546 Personal history of malignant neoplasm of prostate: Secondary | ICD-10-CM | POA: Diagnosis not present

## 2020-12-25 DIAGNOSIS — N433 Hydrocele, unspecified: Secondary | ICD-10-CM | POA: Diagnosis not present

## 2020-12-25 DIAGNOSIS — Z885 Allergy status to narcotic agent status: Secondary | ICD-10-CM | POA: Insufficient documentation

## 2020-12-25 HISTORY — PX: HYDROCELE EXCISION: SHX482

## 2020-12-25 SURGERY — HYDROCELECTOMY
Anesthesia: General | Laterality: Left

## 2020-12-25 MED ORDER — DEXAMETHASONE SODIUM PHOSPHATE 4 MG/ML IJ SOLN
INTRAMUSCULAR | Status: DC | PRN
  Administered 2020-12-25: 4 mg via INTRAVENOUS

## 2020-12-25 MED ORDER — LIDOCAINE 2% (20 MG/ML) 5 ML SYRINGE
INTRAMUSCULAR | Status: DC | PRN
  Administered 2020-12-25: 100 mg via INTRAVENOUS

## 2020-12-25 MED ORDER — FENTANYL CITRATE (PF) 100 MCG/2ML IJ SOLN
INTRAMUSCULAR | Status: AC
  Filled 2020-12-25: qty 2

## 2020-12-25 MED ORDER — ONDANSETRON HCL 4 MG/2ML IJ SOLN
4.0000 mg | Freq: Once | INTRAMUSCULAR | Status: DC | PRN
Start: 2020-12-25 — End: 2020-12-25

## 2020-12-25 MED ORDER — KETOROLAC TROMETHAMINE 30 MG/ML IJ SOLN
INTRAMUSCULAR | Status: AC
  Filled 2020-12-25: qty 1

## 2020-12-25 MED ORDER — BUPIVACAINE HCL (PF) 0.25 % IJ SOLN
INTRAMUSCULAR | Status: AC
  Filled 2020-12-25: qty 30

## 2020-12-25 MED ORDER — CHLORHEXIDINE GLUCONATE 0.12 % MT SOLN: 15.0000 mL | Freq: Once | OROMUCOSAL | Status: AC

## 2020-12-25 MED ORDER — CEFAZOLIN SODIUM-DEXTROSE 2-4 GM/100ML-% IV SOLN
2.0000 g | INTRAVENOUS | Status: AC
  Administered 2020-12-25: 2 g via INTRAVENOUS

## 2020-12-25 MED ORDER — KETOROLAC TROMETHAMINE 30 MG/ML IJ SOLN
INTRAMUSCULAR | Status: DC | PRN
  Administered 2020-12-25: 30 mg via INTRAVENOUS

## 2020-12-25 MED ORDER — OXYCODONE-ACETAMINOPHEN 5-325 MG PO TABS: 1.0000 | ORAL_TABLET | ORAL | 0 refills | Status: AC | PRN

## 2020-12-25 MED ORDER — GLYCOPYRROLATE PF 0.2 MG/ML IJ SOSY
PREFILLED_SYRINGE | INTRAMUSCULAR | Status: DC | PRN
  Administered 2020-12-25: .1 mg via INTRAVENOUS

## 2020-12-25 MED ORDER — LACTATED RINGERS IV SOLN: INTRAVENOUS | Status: DC

## 2020-12-25 MED ORDER — FENTANYL CITRATE (PF) 100 MCG/2ML IJ SOLN
25.0000 ug | INTRAMUSCULAR | Status: DC | PRN
  Administered 2020-12-25: 25 ug via INTRAVENOUS
  Administered 2020-12-25: 50 ug via INTRAVENOUS
  Filled 2020-12-25: qty 2

## 2020-12-25 MED ORDER — PROPOFOL 10 MG/ML IV BOLUS
INTRAVENOUS | Status: DC | PRN
  Administered 2020-12-25: 50 mg via INTRAVENOUS
  Administered 2020-12-25: 130 mg via INTRAVENOUS

## 2020-12-25 MED ORDER — EPHEDRINE 5 MG/ML INJ
INTRAVENOUS | Status: AC
  Filled 2020-12-25: qty 10

## 2020-12-25 MED ORDER — CHLORHEXIDINE GLUCONATE 0.12 % MT SOLN
OROMUCOSAL | Status: AC
  Administered 2020-12-25: 15 mL via OROMUCOSAL
  Filled 2020-12-25: qty 15

## 2020-12-25 MED ORDER — PHENYLEPHRINE 40 MCG/ML (10ML) SYRINGE FOR IV PUSH (FOR BLOOD PRESSURE SUPPORT)
PREFILLED_SYRINGE | INTRAVENOUS | Status: DC | PRN
  Administered 2020-12-25: 80 ug via INTRAVENOUS
  Administered 2020-12-25: 120 ug via INTRAVENOUS
  Administered 2020-12-25: 80 ug via INTRAVENOUS

## 2020-12-25 MED ORDER — GLYCOPYRROLATE PF 0.2 MG/ML IJ SOSY
PREFILLED_SYRINGE | INTRAMUSCULAR | Status: AC
  Filled 2020-12-25: qty 1

## 2020-12-25 MED ORDER — CEFAZOLIN SODIUM-DEXTROSE 2-4 GM/100ML-% IV SOLN
INTRAVENOUS | Status: AC
  Filled 2020-12-25: qty 100

## 2020-12-25 MED ORDER — LIDOCAINE HCL (PF) 2 % IJ SOLN
INTRAMUSCULAR | Status: AC
  Filled 2020-12-25: qty 5

## 2020-12-25 MED ORDER — EPHEDRINE SULFATE-NACL 50-0.9 MG/10ML-% IV SOSY
PREFILLED_SYRINGE | INTRAVENOUS | Status: DC | PRN
  Administered 2020-12-25: 5 mg via INTRAVENOUS

## 2020-12-25 MED ORDER — ORAL CARE MOUTH RINSE: 15.0000 mL | Freq: Once | OROMUCOSAL | Status: AC

## 2020-12-25 MED ORDER — PROPOFOL 10 MG/ML IV BOLUS
INTRAVENOUS | Status: AC
  Filled 2020-12-25: qty 20

## 2020-12-25 MED ORDER — FENTANYL CITRATE (PF) 100 MCG/2ML IJ SOLN
INTRAMUSCULAR | Status: DC | PRN
  Administered 2020-12-25 (×2): 50 ug via INTRAVENOUS

## 2020-12-25 MED ORDER — ONDANSETRON HCL 4 MG/2ML IJ SOLN
INTRAMUSCULAR | Status: AC
  Filled 2020-12-25: qty 2

## 2020-12-25 MED ORDER — 0.9 % SODIUM CHLORIDE (POUR BTL) OPTIME
TOPICAL | Status: DC | PRN
  Administered 2020-12-25: 1000 mL

## 2020-12-25 MED ORDER — PHENYLEPHRINE 40 MCG/ML (10ML) SYRINGE FOR IV PUSH (FOR BLOOD PRESSURE SUPPORT)
PREFILLED_SYRINGE | INTRAVENOUS | Status: AC
  Filled 2020-12-25: qty 10

## 2020-12-25 MED ORDER — BUPIVACAINE HCL (PF) 0.25 % IJ SOLN
INTRAMUSCULAR | Status: DC | PRN
  Administered 2020-12-25: 10 mL

## 2020-12-25 MED ORDER — ONDANSETRON HCL 4 MG/2ML IJ SOLN
INTRAMUSCULAR | Status: DC | PRN
  Administered 2020-12-25: 4 mg via INTRAVENOUS

## 2020-12-25 SURGICAL SUPPLY — 31 items
ADH SKN CLS APL DERMABOND .7 (GAUZE/BANDAGES/DRESSINGS) ×1
BLADE SURG 15 STRL LF DISP TIS (BLADE) ×1 IMPLANT
BLADE SURG 15 STRL SS (BLADE) ×2
COVER LIGHT HANDLE STERIS (MISCELLANEOUS) ×4 IMPLANT
COVER WAND RF STERILE (DRAPES) ×2 IMPLANT
DECANTER SPIKE VIAL GLASS SM (MISCELLANEOUS) ×2 IMPLANT
DERMABOND ADVANCED (GAUZE/BANDAGES/DRESSINGS) ×1
DERMABOND ADVANCED .7 DNX12 (GAUZE/BANDAGES/DRESSINGS) ×1 IMPLANT
ELECT REM PT RETURN 9FT ADLT (ELECTROSURGICAL) ×2
ELECTRODE REM PT RTRN 9FT ADLT (ELECTROSURGICAL) ×1 IMPLANT
GAUZE SPONGE 4X4 12PLY STRL (GAUZE/BANDAGES/DRESSINGS) ×4 IMPLANT
GLOVE BIO SURGEON STRL SZ8 (GLOVE) ×2 IMPLANT
GLOVE SRG 8 PF TXTR STRL LF DI (GLOVE) ×1 IMPLANT
GLOVE SURG UNDER POLY LF SZ7 (GLOVE) ×4 IMPLANT
GLOVE SURG UNDER POLY LF SZ8 (GLOVE) ×2
GOWN STRL REUS W/TWL LRG LVL3 (GOWN DISPOSABLE) ×2 IMPLANT
GOWN STRL REUS W/TWL XL LVL3 (GOWN DISPOSABLE) ×2 IMPLANT
KIT TURNOVER KIT A (KITS) ×2 IMPLANT
MANIFOLD NEPTUNE II (INSTRUMENTS) ×2 IMPLANT
NEEDLE HYPO 25X1 1.5 SAFETY (NEEDLE) ×2 IMPLANT
NS IRRIG 1000ML POUR BTL (IV SOLUTION) ×2 IMPLANT
PACK MINOR (CUSTOM PROCEDURE TRAY) ×2 IMPLANT
PAD ARMBOARD 7.5X6 YLW CONV (MISCELLANEOUS) ×2 IMPLANT
PENCIL SMOKE EVACUATOR (MISCELLANEOUS) ×2 IMPLANT
SET BASIN LINEN APH (SET/KITS/TRAYS/PACK) ×2 IMPLANT
SUPPORT SCROTAL LG STRP (MISCELLANEOUS) ×2 IMPLANT
SUT MNCRL AB 4-0 PS2 18 (SUTURE) ×2 IMPLANT
SUT VIC AB 3-0 SH 27 (SUTURE) ×2
SUT VIC AB 3-0 SH 27X BRD (SUTURE) ×1 IMPLANT
SYR CONTROL 10ML LL (SYRINGE) ×2 IMPLANT
YANKAUER SUCT 12FT TUBE ARGYLE (SUCTIONS) ×2 IMPLANT

## 2020-12-25 NOTE — Anesthesia Procedure Notes (Signed)
Procedure Name: LMA Insertion Date/Time: 12/25/2020 8:57 AM Performed by: Orlie Dakin, CRNA Pre-anesthesia Checklist: Patient identified, Emergency Drugs available, Suction available and Patient being monitored Patient Re-evaluated:Patient Re-evaluated prior to induction Oxygen Delivery Method: Circle system utilized Preoxygenation: Pre-oxygenation with 100% oxygen Induction Type: IV induction LMA: LMA inserted LMA Size: 4.0 Tube type: Oral Number of attempts: 1 Placement Confirmation: positive ETCO2 Tube secured with: Tape Dental Injury: Teeth and Oropharynx as per pre-operative assessment

## 2020-12-25 NOTE — Anesthesia Preprocedure Evaluation (Signed)
Anesthesia Evaluation  Patient identified by MRN, date of birth, ID band Patient awake    Reviewed: Allergy & Precautions, H&P , NPO status , Patient's Chart, lab work & pertinent test results, reviewed documented beta blocker date and time   Airway Mallampati: II  TM Distance: >3 FB Neck ROM: full    Dental no notable dental hx.    Pulmonary neg pulmonary ROS, Current Smoker,    Pulmonary exam normal breath sounds clear to auscultation       Cardiovascular Exercise Tolerance: Good negative cardio ROS   Rhythm:regular Rate:Normal     Neuro/Psych PSYCHIATRIC DISORDERS Anxiety negative neurological ROS     GI/Hepatic negative GI ROS, Neg liver ROS,   Endo/Other  negative endocrine ROS  Renal/GU negative Renal ROS  negative genitourinary   Musculoskeletal   Abdominal   Peds  Hematology negative hematology ROS (+)   Anesthesia Other Findings   Reproductive/Obstetrics negative OB ROS                             Anesthesia Physical Anesthesia Plan  ASA: 2  Anesthesia Plan: General and General LMA   Post-op Pain Management:    Induction:   PONV Risk Score and Plan: Ondansetron  Airway Management Planned:   Additional Equipment:   Intra-op Plan:   Post-operative Plan:   Informed Consent: I have reviewed the patients History and Physical, chart, labs and discussed the procedure including the risks, benefits and alternatives for the proposed anesthesia with the patient or authorized representative who has indicated his/her understanding and acceptance.     Dental Advisory Given  Plan Discussed with: CRNA  Anesthesia Plan Comments:         Anesthesia Quick Evaluation

## 2020-12-25 NOTE — Transfer of Care (Signed)
Immediate Anesthesia Transfer of Care Note  Patient: David White  Procedure(s) Performed: EXCISION OF LEFT EPIDIDYMAL CYST (Left)  Patient Location: PACU  Anesthesia Type:General  Level of Consciousness: awake, alert  and oriented  Airway & Oxygen Therapy: Patient Spontanous Breathing  Post-op Assessment: Report given to RN and Post -op Vital signs reviewed and stable  Post vital signs: Reviewed and stable  Last Vitals:  Vitals Value Taken Time  BP    Temp    Pulse 85 12/25/20 0941  Resp 14 12/25/20 0941  SpO2 98 % 12/25/20 0941  Vitals shown include unvalidated device data.  Last Pain:  Vitals:   12/25/20 0744  TempSrc: Oral  PainSc: 3       Patients Stated Pain Goal: 7 (75/83/07 4600)  Complications: No notable events documented.

## 2020-12-25 NOTE — H&P (Signed)
Urology Admission H&P  Chief Complaint: left scrotal swelling  History of Present Illness: David White is a 70yo who developed a left hydrocele which is growing in size and causing pain with ambulation. He denies any trauma. No LUTS. No other complaints today  Past Medical History:  Diagnosis Date   Anxiety    Arthritis    Hepatitis    hepatitis a as child   Hypercholesteremia    Macular degeneration    left eye   Prostate cancer ([place])    Tobacco abuse    Past Surgical History:  Procedure Laterality Date   APPENDECTOMY     CYSTOSCOPY  04/28/2018   Procedure: CYSTOSCOPY FLEXIBLE;  Surgeon: Cleon Gustin, MD;  Location: [facility];  Service: Urology;;  no seeds found in bladder   EYE SURGERY  10-15 yrs ago   ioc for cataracts   PROSTATE BIOPSY N/A 01/09/2018   Procedure: BIOPSY TRANSRECTAL ULTRASONIC PROSTATE (TUBP);  Surgeon: Cleon Gustin, MD;  Location: [facility];  Service: Urology;  Laterality: N/A;  [place] IMPLANT N/A 04/28/2018   Procedure: RADIOACTIVE SEED IMPLANT/BRACHYTHERAPY IMPLANT;  Surgeon: Cleon Gustin, MD;  Location: [facility];  Service: Urology;  Laterality: N/A;   78 seeds implanted   removal of cyst     back of head   SHOULDER SURGERY Right    SPACE OAR INSTILLATION N/A 04/28/2018   Procedure: SPACE OAR INSTILLATION;  Surgeon: Cleon Gustin, MD;  Location: [facility];  Service: Urology;  Laterality: N/A;    Home Medications:  No current facility-administered medications for this encounter.   Allergies:  Allergies  Allergen Reactions   Codeine Other (See Comments)    Headache dizziness    Family History  Problem Relation Age of Onset   Lung cancer Father    Prostate cancer Neg Hx    Colon cancer Neg Hx    Pancreatic cancer Neg Hx    Breast cancer Neg Hx    Social History:  reports that he has been smoking cigarettes. He has a 60.00 pack-year smoking history. He  has never used smokeless tobacco. He reports current alcohol use. He reports that he does not use drugs.  Review of Systems  Genitourinary:  Positive for scrotal swelling.  All other systems reviewed and are negative.  Physical Exam:  Vital signs in last 24 hours: Temp:  [97.9 F (36.6 C)] 97.9 F (36.6 C) (06/16 0744) Pulse Rate:  [74] 74 (06/16 0744) Resp:  [18] 18 (06/16 0744) BP: (143)/(72) 143/72 (06/16 0744) SpO2:  [97 %] 97 % (06/16 0744) Physical Exam Constitutional:      Appearance: Normal appearance.  HENT:     Head: Normocephalic and atraumatic.     Nose: Nose normal.     Mouth/Throat:     Mouth: Mucous membranes are dry.  Eyes:     Extraocular Movements: Extraocular movements intact.     Pupils: Pupils are equal, round, and reactive to light.  Cardiovascular:     Rate and Rhythm: Normal rate and regular rhythm.  Pulmonary:     Effort: Pulmonary effort is normal. No respiratory distress.  Abdominal:     General: Abdomen is flat. There is no distension.  Musculoskeletal:        General: No swelling. Normal range of motion.     Cervical back: Normal range of motion and neck supple.  Skin:    General: Skin is warm and  dry.  Neurological:     General: No focal deficit present.     Mental Status: He is alert and oriented to person, place, and time.  Psychiatric:        Mood and Affect: Mood normal.        Behavior: Behavior normal.        Thought Content: Thought content normal.        Judgment: Judgment normal.    Laboratory Data:  No results found for this or any previous visit (from the past 24 hour(s)). No results found for this or any previous visit (from the past 240 hour(s)). Creatinine: No results for input(s): CREATININE in the last 168 hours. Baseline Creatinine: unknown  Impression/Assessment:  69yo with left hydrocele  Plan:  The risks/benefits/alternatives to left hydrocelectomy was explained to the patient and he understands and wishes to  proceed with surgery  Nicolette Bang 12/25/2020, 7:49 AM

## 2020-12-25 NOTE — Op Note (Signed)
Preoperative diagnosis: Left hydrocele  Postoperative diagnosis: left epididymal cyst, left hydrocele  Procedure: 1. Excision of left epididymal cyst 2. Excision of left appendix testis 3. Left hydrocelectomy  Attending: Nicolette Bang, MD  Anesthesia: General  History of blood loss: Minimal  Antibiotics: ancef  Drains: none  Specimens: 1. Left epididymal cyst   Findings: 10cm left epididymal cyst. Small 4cm hydrocele  Indications: Patient is a 70 year old male with a history of left epididymal cyst and hydrocele that was growing in size and causing him pain with walking.  We discussed the treatment options including observation versus excision after discussing treatment options he and his mother decided to proceed with excision.   Procedure in detail: Prior to procedure consent was obtained.  Patient was brought to the operating room and a brief timeout was done to ensure correct patient, correct procedure, correct site.  General anesthesia was administered and patient was placed in supine position.  His genitalia was then prepped and draped in usual sterile fashion.  A 5 cm incision was made in the left hemiscrotum.  We dissected down to the tunica and then incised the tunica. A small hydrocele was encountered. We then excised the hydrocele sac and then over sewed the edge with 2-0 Vicryl in a running fashion. We then excised the left appendix testis and sent it for pathology. We then used sharp dissection to excision the left epididymal cyst intact. This was then sent for pathology. Hemostasis was then obtained with electrocautery. We then closed the defect in the epididymis with 3-0 vicryl in a running fashion. We then returned the testis to the left hemiscrotum and closed the overlying dartos with 3-0 vicryl in a running fashion. The skin was then closed with 4-0 monocryl in a running fashion. Dermabond was placed on the incision.  A dressing was then applied to the incision.  We then  placed a scrotal fluff and this then concluded the procedure which was well tolerated by the patient.  Complications: None  Condition: Stable, extubated, transferred to PACU.  Plan: Patient is to be discharged home.  He is to follow up in 2 weeks for wound check.

## 2020-12-26 NOTE — Anesthesia Postprocedure Evaluation (Signed)
Anesthesia Post Note  Patient: David White  Procedure(s) Performed: EXCISION OF LEFT EPIDIDYMAL CYST (Left)  Patient location during evaluation: Phase II Anesthesia Type: General Level of consciousness: awake Pain management: pain level controlled Vital Signs Assessment: post-procedure vital signs reviewed and stable Respiratory status: spontaneous breathing and respiratory function stable Cardiovascular status: blood pressure returned to baseline and stable Postop Assessment: no headache and no apparent nausea or vomiting Anesthetic complications: no Comments: Late entry   No notable events documented.   Last Vitals:  Vitals:   12/25/20 1031 12/25/20 1038  BP: 119/62 (!) 142/73  Pulse:  69  Resp: 14 18  Temp:  (!) 36.2 C  SpO2: 95% 94%    Last Pain:  Vitals:   12/25/20 1038  TempSrc: Axillary  PainSc: [place]

## 2020-12-27 LAB — SURGICAL PATHOLOGY

## 2020-12-30 ENCOUNTER — Encounter (HOSPITAL_COMMUNITY): Payer: Self-pay | Admitting: Urology

## 2021-01-09 ENCOUNTER — Other Ambulatory Visit: Payer: Self-pay

## 2021-01-09 ENCOUNTER — Ambulatory Visit: Payer: PPO | Admitting: Urology

## 2021-01-09 VITALS — BP 135/64 | HR 93

## 2021-01-09 DIAGNOSIS — C61 Malignant neoplasm of prostate: Secondary | ICD-10-CM

## 2021-01-09 DIAGNOSIS — N5201 Erectile dysfunction due to arterial insufficiency: Secondary | ICD-10-CM

## 2021-01-09 LAB — URINALYSIS, ROUTINE W REFLEX MICROSCOPIC
Bilirubin, UA: NEGATIVE
Glucose, UA: NEGATIVE
Ketones, UA: NEGATIVE
Leukocytes,UA: NEGATIVE
Nitrite, UA: NEGATIVE
Protein,UA: NEGATIVE
RBC, UA: NEGATIVE
Specific Gravity, UA: 1.015 (ref 1.005–1.030)
Urobilinogen, Ur: 0.2 mg/dL (ref 0.2–1.0)
pH, UA: 7 (ref 5.0–7.5)

## 2021-01-09 MED ORDER — SILDENAFIL CITRATE 100 MG PO TABS: 100.0000 mg | ORAL_TABLET | Freq: Every day | ORAL | 0 refills | Status: DC | PRN

## 2021-01-09 NOTE — Progress Notes (Signed)
01/09/2021 11:49 AM   David White 1950-08-31 [id number]  Referring provider: Redmond School, MD [address] [place],  [place] [address]  Followup left spermatocelectomy   HPI: David White is a [phone] here for followup after left spermatocelectomy. Mild left testicular pain. NO drainage from incision. No worsening swelling of left scrotum. No other complaints today   PMH: Past Medical History:  Diagnosis Date   Anxiety    Arthritis    Hepatitis    hepatitis a as child   Hypercholesteremia    Macular degeneration    left eye   Prostate cancer ([place])    Tobacco abuse     Surgical History: Past Surgical History:  Procedure Laterality Date   APPENDECTOMY     CYSTOSCOPY  04/28/2018   Procedure: CYSTOSCOPY FLEXIBLE;  Surgeon: Cleon Gustin, MD;  Location: [facility];  Service: Urology;;  no seeds found in bladder   EYE SURGERY  10-15 yrs ago   ioc for cataracts   HYDROCELE EXCISION Left 12/25/2020   Procedure: EXCISION OF LEFT EPIDIDYMAL CYST;  Surgeon: Cleon Gustin, MD;  Location: [facility];  Service: Urology;  Laterality: Left;   PROSTATE BIOPSY N/A 01/09/2018   Procedure: BIOPSY TRANSRECTAL ULTRASONIC PROSTATE (TUBP);  Surgeon: Cleon Gustin, MD;  Location: [facility];  Service: Urology;  Laterality: N/A;  [place] IMPLANT N/A 04/28/2018   Procedure: RADIOACTIVE SEED IMPLANT/BRACHYTHERAPY IMPLANT;  Surgeon: Cleon Gustin, MD;  Location: [facility];  Service: Urology;  Laterality: N/A;   78 seeds implanted   removal of cyst     back of head   SHOULDER SURGERY Right    SPACE OAR INSTILLATION N/A 04/28/2018   Procedure: SPACE OAR INSTILLATION;  Surgeon: Cleon Gustin, MD;  Location: [facility];  Service: Urology;  Laterality: N/A;    Home Medications:  Allergies as of 01/09/2021       Reactions   Codeine Other (See Comments)   Headache dizziness        Medication List         Accurate as of January 09, 2021 11:49 AM. If you have any questions, ask your nurse or doctor.          ALPRAZolam 0.5 MG tablet Commonly known as: XANAX Take 1 tablet (0.5 mg total) by mouth 3 (three) times daily as needed for anxiety or sleep.   AMBULATORY NON FORMULARY MEDICATION 0.2 mLs by Intracavernosal route as needed. Medication Name: Trimix  PGE 74mcg Pap 30mg  Phent 1mg    Ciprodex OTIC suspension Generic drug: ciprofloxacin-dexamethasone Place 4-5 drops into both ears 2 (two) times daily as needed (ear infection).   ibuprofen 800 MG tablet Commonly known as: ADVIL Take 800 mg by mouth every 8 (eight) hours as needed for moderate pain.   oxyCODONE-acetaminophen 5-325 MG tablet Commonly known as: Percocet Take 1 tablet by mouth every 4 (four) hours as needed for severe pain.   rosuvastatin 10 MG tablet Commonly known as: CRESTOR Take 10 mg by mouth at bedtime.        Allergies:  Allergies  Allergen Reactions   Codeine Other (See Comments)    Headache dizziness    Family History: Family History  Problem Relation Age of Onset   Lung cancer Father    Prostate cancer Neg Hx    Colon cancer Neg Hx    Pancreatic cancer Neg Hx    Breast cancer Neg Hx  Social History:  reports that he has been smoking cigarettes. He has a 60.00 pack-year smoking history. He has never used smokeless tobacco. He reports current alcohol use. He reports that he does not use drugs.  ROS: All other review of systems were reviewed and are negative except what is noted above in HPI  Physical Exam: BP 135/64   Pulse 93   Constitutional:  Alert and oriented, No acute distress. HEENT: [place] AT, moist mucus membranes.  Trachea midline, no masses. Cardiovascular: No clubbing, cyanosis, or edema. Respiratory: Normal respiratory effort, no increased work of breathing. GI: Abdomen is soft, nontender, nondistended, no abdominal masses GU: No CVA tenderness. Circumcised phallus. No  masses/lesions on penis, testis, scrotum. Prostate 40g smooth no nodules no induration.  Lymph: No cervical or inguinal lymphadenopathy. Skin: No rashes, bruises or suspicious lesions. Neurologic: Grossly intact, no focal deficits, moving all 4 extremities. Psychiatric: Normal mood and affect.  Laboratory Data: Lab Results  Component Value Date   WBC 9.2 12/23/2020   HGB 14.5 12/23/2020   HCT 43.8 12/23/2020   MCV 97.3 12/23/2020   PLT 250 12/23/2020    Lab Results  Component Value Date   CREATININE 0.91 07/05/2020    Lab Results  Component Value Date   PSA 0.2 08/09/2019    Lab Results  Component Value Date   TESTOSTERONE 602 11/14/2020    Lab Results  Component Value Date   HGBA1C 5.4 12/31/2012    Urinalysis    Component Value Date/Time   COLORURINE YELLOW 07/05/2020 1544   APPEARANCEUR Clear 11/14/2020 0902   LABSPEC 1.013 07/05/2020 1544   PHURINE 6.0 07/05/2020 1544   GLUCOSEU Negative 11/14/2020 0902   HGBUR NEGATIVE 07/05/2020 1544   BILIRUBINUR Negative 11/14/2020 0902   KETONESUR 5 (A) 07/05/2020 1544   PROTEINUR Negative 11/14/2020 0902   PROTEINUR NEGATIVE 07/05/2020 1544   NITRITE Negative 11/14/2020 0902   NITRITE NEGATIVE 07/05/2020 1544   LEUKOCYTESUR Negative 11/14/2020 0902   LEUKOCYTESUR NEGATIVE 07/05/2020 1544    Lab Results  Component Value Date   LABMICR Comment 04/02/2020   BACTERIA NONE SEEN 12/01/2017    Pertinent Imaging:  No results found for this or any previous visit.  No results found for this or any previous visit.  No results found for this or any previous visit.  No results found for this or any previous visit.  No results found for this or any previous visit.  No results found for this or any previous visit.  No results found for this or any previous visit.  No results found for this or any previous visit.   Assessment & Plan:    1. Left epididymal cyst -continue scrotal support -RCT 05/2021   No  follow-ups on file.  Nicolette Bang, MD  [facility] [place]

## 2021-01-09 NOTE — Progress Notes (Signed)
Urological Symptom Review  Patient is experiencing the following symptoms: Get up at night to urinate Erection problems (male only)   Review of Systems  Gastrointestinal (upper)  : Negative for upper GI symptoms  Gastrointestinal (lower) : Negative for lower GI symptoms  Constitutional : Fatigue  Skin: Negative for skin symptoms  Eyes: Blurred vision   Ear/Nose/Throat : Negative for Ear/Nose/Throat symptoms  Hematologic/Lymphatic: Negative for Hematologic/Lymphatic symptoms  Cardiovascular : Negative for cardiovascular symptoms  Respiratory : Negative for respiratory symptoms  Endocrine: Negative for endocrine symptoms  Musculoskeletal: Back pain Joint pain  Neurological: Negative for neurological symptoms  Psychologic: Negative for psychiatric symptoms

## 2021-02-20 ENCOUNTER — Other Ambulatory Visit (HOSPITAL_COMMUNITY): Payer: Self-pay | Admitting: Family Medicine

## 2021-02-20 ENCOUNTER — Ambulatory Visit (HOSPITAL_COMMUNITY)
Admission: RE | Admit: 2021-02-20 | Discharge: 2021-02-20 | Disposition: A | Discharge status: Home / Self Care | Payer: PPO | Source: Ambulatory Visit | Attending: Family Medicine | Admitting: Family Medicine

## 2021-02-20 ENCOUNTER — Other Ambulatory Visit: Payer: Self-pay

## 2021-02-20 DIAGNOSIS — M542 Cervicalgia: Secondary | ICD-10-CM | POA: Diagnosis not present

## 2021-02-20 DIAGNOSIS — S8001XA Contusion of right knee, initial encounter: Secondary | ICD-10-CM | POA: Insufficient documentation

## 2021-02-20 DIAGNOSIS — Z6822 Body mass index (BMI) 22.0-22.9, adult: Secondary | ICD-10-CM | POA: Diagnosis not present

## 2021-02-20 DIAGNOSIS — S134XXA Sprain of ligaments of cervical spine, initial encounter: Secondary | ICD-10-CM | POA: Diagnosis not present
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
## 2021-02-26 DIAGNOSIS — U071 COVID-19: Secondary | ICD-10-CM | POA: Diagnosis not present

## 2021-03-19 DIAGNOSIS — M5481 Occipital neuralgia: Secondary | ICD-10-CM | POA: Diagnosis not present

## 2021-03-19 DIAGNOSIS — Z6822 Body mass index (BMI) 22.0-22.9, adult: Secondary | ICD-10-CM | POA: Diagnosis not present

## 2021-03-19 DIAGNOSIS — M5030 Other cervical disc degeneration, unspecified cervical region: Secondary | ICD-10-CM | POA: Diagnosis not present

## 2021-03-19 DIAGNOSIS — M47812 Spondylosis without myelopathy or radiculopathy, cervical region: Secondary | ICD-10-CM | POA: Diagnosis not present

## 2021-03-19 DIAGNOSIS — N400 Enlarged prostate without lower urinary tract symptoms: Secondary | ICD-10-CM | POA: Diagnosis not present

## 2021-03-19 DIAGNOSIS — M546 Pain in thoracic spine: Secondary | ICD-10-CM | POA: Diagnosis not present

## 2021-03-19 DIAGNOSIS — M25561 Pain in right knee: Secondary | ICD-10-CM | POA: Diagnosis not present

## 2021-03-23 ENCOUNTER — Other Ambulatory Visit (HOSPITAL_COMMUNITY): Payer: Self-pay | Admitting: Internal Medicine

## 2021-03-23 ENCOUNTER — Other Ambulatory Visit: Payer: Self-pay | Admitting: Internal Medicine

## 2021-03-23 DIAGNOSIS — M542 Cervicalgia: Secondary | ICD-10-CM

## 2021-03-23 DIAGNOSIS — M546 Pain in thoracic spine: Secondary | ICD-10-CM

## 2021-03-24 ENCOUNTER — Other Ambulatory Visit: Payer: Self-pay | Admitting: Internal Medicine

## 2021-03-24 ENCOUNTER — Other Ambulatory Visit (HOSPITAL_COMMUNITY): Payer: Self-pay | Admitting: Internal Medicine

## 2021-03-24 DIAGNOSIS — M546 Pain in thoracic spine: Secondary | ICD-10-CM

## 2021-03-24 DIAGNOSIS — M25561 Pain in right knee: Secondary | ICD-10-CM

## 2021-03-24 DIAGNOSIS — M542 Cervicalgia: Secondary | ICD-10-CM

## 2021-04-10 ENCOUNTER — Ambulatory Visit (HOSPITAL_COMMUNITY)
Admission: RE | Admit: 2021-04-10 | Discharge: 2021-04-10 | Disposition: A | Discharge status: Home / Self Care | Payer: PPO | Source: Ambulatory Visit | Attending: Internal Medicine | Admitting: Internal Medicine

## 2021-04-10 ENCOUNTER — Other Ambulatory Visit: Payer: Self-pay

## 2021-04-10 DIAGNOSIS — M47814 Spondylosis without myelopathy or radiculopathy, thoracic region: Secondary | ICD-10-CM | POA: Diagnosis not present

## 2021-04-10 DIAGNOSIS — M5124 Other intervertebral disc displacement, thoracic region: Secondary | ICD-10-CM | POA: Diagnosis not present

## 2021-04-10 DIAGNOSIS — M25561 Pain in right knee: Secondary | ICD-10-CM | POA: Insufficient documentation

## 2021-04-10 DIAGNOSIS — M542 Cervicalgia: Secondary | ICD-10-CM | POA: Insufficient documentation

## 2021-04-10 DIAGNOSIS — M4804 Spinal stenosis, thoracic region: Secondary | ICD-10-CM | POA: Diagnosis not present

## 2021-04-10 DIAGNOSIS — Z8546 Personal history of malignant neoplasm of prostate: Secondary | ICD-10-CM | POA: Diagnosis not present

## 2021-04-10 DIAGNOSIS — M546 Pain in thoracic spine: Secondary | ICD-10-CM | POA: Insufficient documentation

## 2021-04-10 IMAGING — MR MR KNEE*R* W/O CM
7 series · 40 of 40 positions shown · non-contrast
Comparison: None.

CLINICAL DATA: Right knee pain.

EXAM:
MRI OF THE RIGHT KNEE WITHOUT CONTRAST
TECHNIQUE: Multiplanar, multisequence MR imaging of the knee was performed. No
intravenous contrast was administered.

[Series 8: T2 fat-sat · axial · right · 4.0mm · 0.47mm/px · z∈[-60,+64]mm · 5 of 26 slices shown (1 of 3)]
[im 1/26]
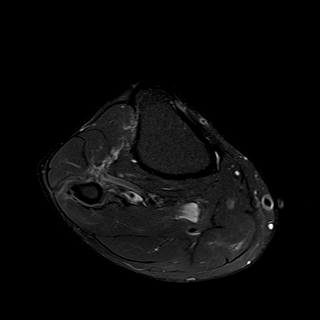
[im 7/26]
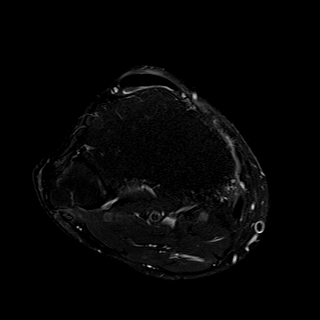
[im 13/26]
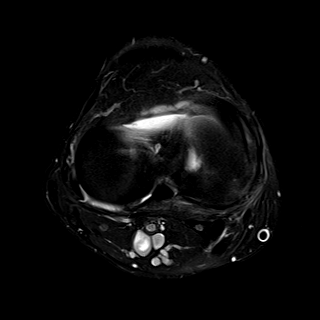
[im 19/26]
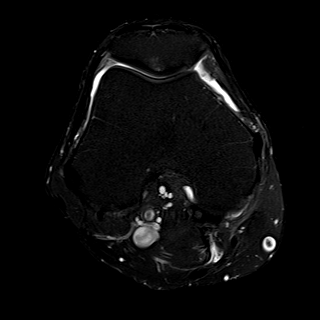
[im 26/26]
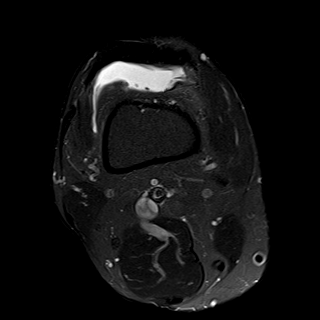

[Series 9: T1 · coronal · right · 4.0mm · 0.59mm/px · 6 of 26 slices shown]
[im 1/26]
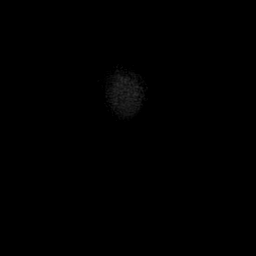
[im 6/26]
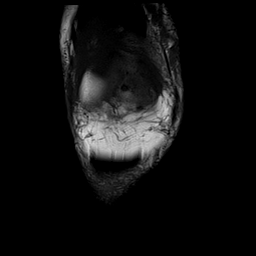
[im 11/26]
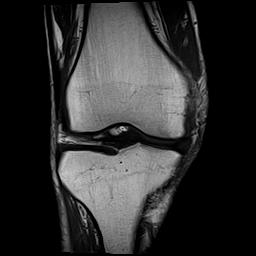
[im 16/26]
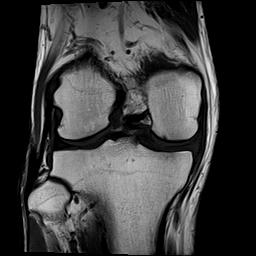
[im 21/26]
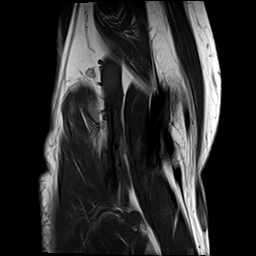
[im 26/26]
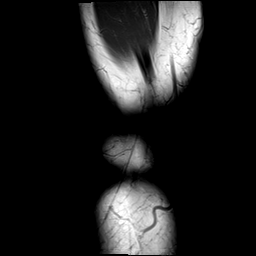

[Series 10: T2 fat-sat · coronal · right · 4.0mm · 0.59mm/px · 6 of 26 slices shown (2 of 3)]
[im 1/26]
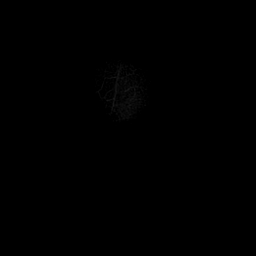
[im 6/26]
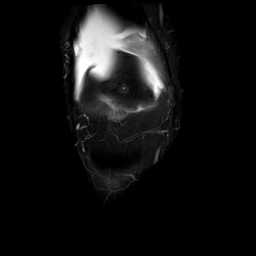
[im 11/26]
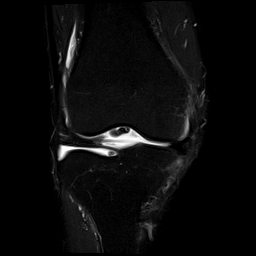
[im 16/26]
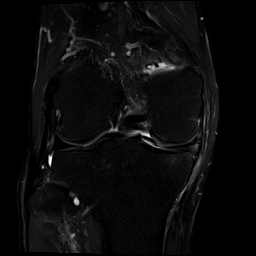
[im 21/26]
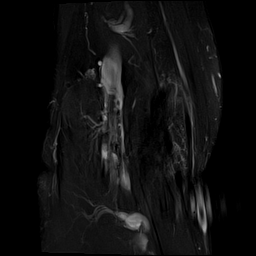
[im 26/26]
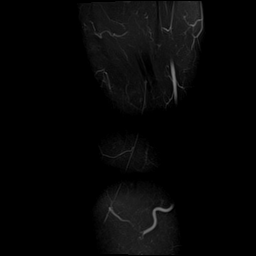

[Series 11: PD fat-sat · coronal · right · 3.0mm · 0.59mm/px · 7 of 35 slices shown (1 of 2)]
[im 1/35]
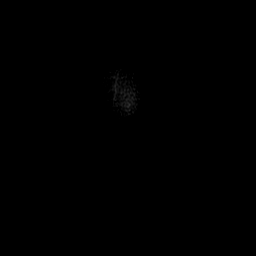
[im 6/35]
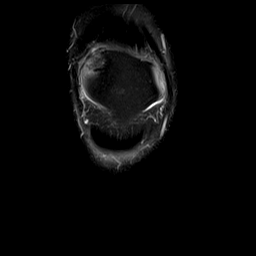
[im 12/35]
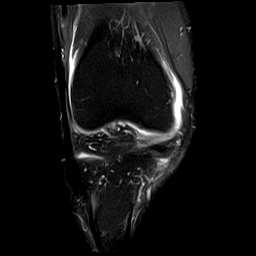
[im 18/35]
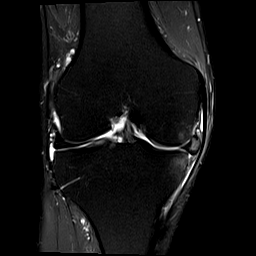
[im 23/35]
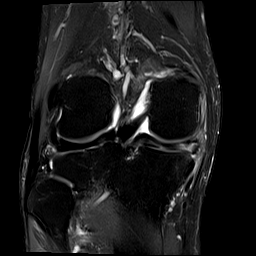
[im 29/35]
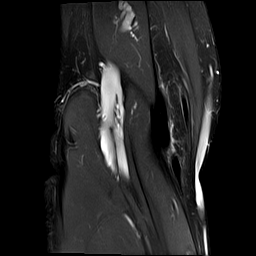
[im 35/35]
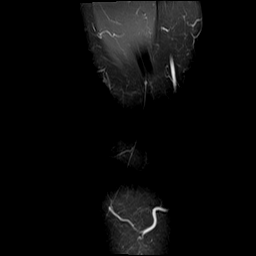

[Series 12: PD fat-sat · sagittal · right · 3.0mm · 0.59mm/px · 6 of 28 slices shown (2 of 2)]
[im 1/28]
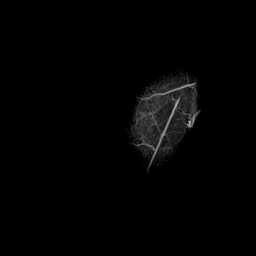
[im 6/28]
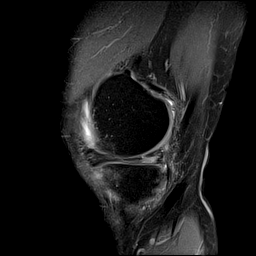
[im 11/28]
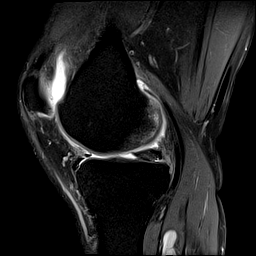
[im 17/28]
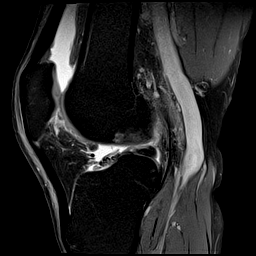
[im 22/28]
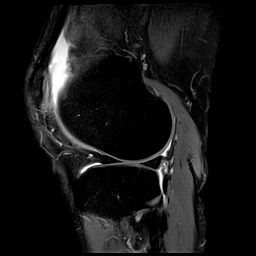
[im 28/28]
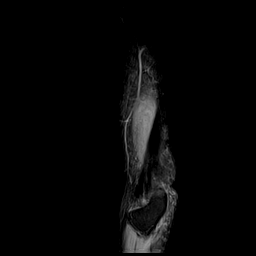

[Series 13: T2 fat-sat · sagittal · right · 3.0mm · 0.59mm/px · 6 of 28 slices shown (3 of 3)]
[im 1/28]
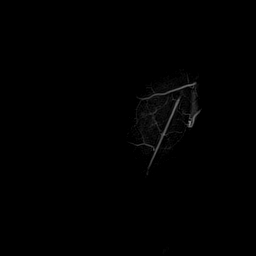
[im 6/28]
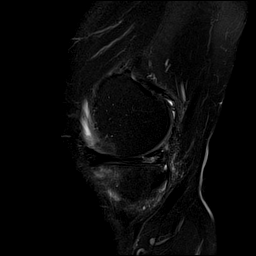
[im 11/28]
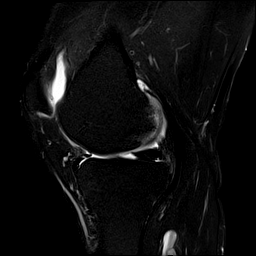
[im 17/28]
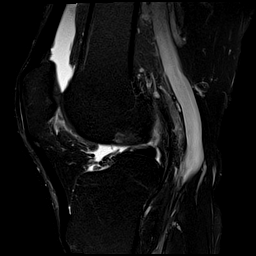
[im 22/28]
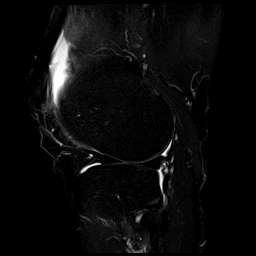
[im 28/28]
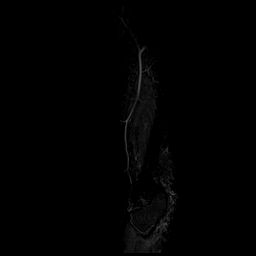

[Series 14: PD · coronal · right · 2.0mm · 0.47mm/px · 4 of 20 slices shown]
[im 1/20]
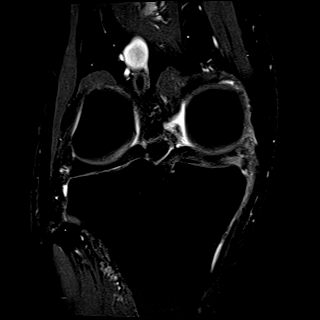
[im 7/20]
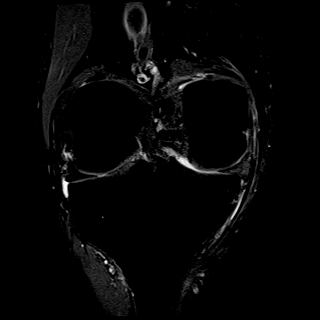
[im 13/20]
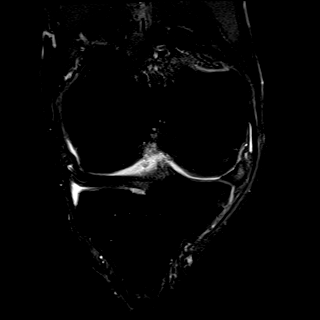
[im 20/20]
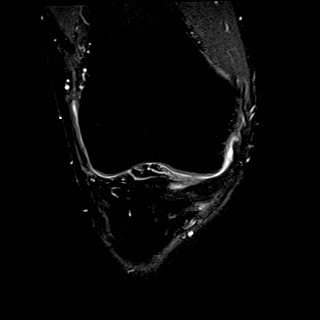

[40 of 40 positions shown; findings below may reference images not displayed]

FINDINGS: MENISCI

Medial: Complex tear of the posterior horn medial meniscus extending
into the body of the medial meniscus. Degeneration of the posterior
horn body of the medial meniscus. Peripheral meniscal extrusion.

Lateral: Intact.

LIGAMENTS

Cruciates: ACL and PCL are intact.

Collaterals: Medial collateral ligament is intact. Lateral
collateral ligament complex is intact.

CARTILAGE

Patellofemoral:  Mild cartilage fissuring the patellar apex.

Medial: Partial-thickness cartilage loss of the medial femorotibial
compartment.

Lateral:  No chondral defect.

JOINT: Large joint effusion. Normal MOOLMAN. No plical
thickening.

POPLITEAL FOSSA: Popliteus tendon is intact. Tiny Baker's cyst.

EXTENSOR MECHANISM: Intact quadriceps tendon. Intact patellar
tendon. Intact lateral patellar retinaculum. Intact medial patellar
retinaculum. Intact MPFL.

BONES: No aggressive osseous lesion. No fracture or dislocation.

Other: No fluid collection or hematoma. Muscles are normal.
IMPRESSION: 1. Complex tear of the posterior horn medial meniscus extending into
the body of the medial meniscus. Degeneration of the posterior horn
body of the medial meniscus. Peripheral meniscal extrusion.
2. Partial-thickness cartilage loss of the medial femorotibial
compartment.
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
## 2021-04-17 DIAGNOSIS — M4802 Spinal stenosis, cervical region: Secondary | ICD-10-CM | POA: Diagnosis not present

## 2021-04-23 DIAGNOSIS — M4802 Spinal stenosis, cervical region: Secondary | ICD-10-CM | POA: Diagnosis not present

## 2021-04-30 DIAGNOSIS — M4802 Spinal stenosis, cervical region: Secondary | ICD-10-CM | POA: Diagnosis not present

## 2021-05-01 DIAGNOSIS — S83241S Other tear of medial meniscus, current injury, right knee, sequela: Secondary | ICD-10-CM | POA: Diagnosis not present

## 2021-05-01 DIAGNOSIS — M25561 Pain in right knee: Secondary | ICD-10-CM | POA: Diagnosis not present

## 2021-05-07 DIAGNOSIS — M4802 Spinal stenosis, cervical region: Secondary | ICD-10-CM | POA: Diagnosis not present

## 2021-05-07 DIAGNOSIS — S83241D Other tear of medial meniscus, current injury, right knee, subsequent encounter: Secondary | ICD-10-CM | POA: Diagnosis not present

## 2021-05-14 ENCOUNTER — Other Ambulatory Visit: Payer: Self-pay

## 2021-05-14 ENCOUNTER — Other Ambulatory Visit: Payer: PPO

## 2021-05-15 ENCOUNTER — Other Ambulatory Visit: Payer: PPO

## 2021-05-15 DIAGNOSIS — M4802 Spinal stenosis, cervical region: Secondary | ICD-10-CM | POA: Diagnosis not present

## 2021-05-21 ENCOUNTER — Other Ambulatory Visit: Payer: PPO

## 2021-05-21 ENCOUNTER — Other Ambulatory Visit: Payer: Self-pay | Admitting: Urology

## 2021-05-21 DIAGNOSIS — M4802 Spinal stenosis, cervical region: Secondary | ICD-10-CM | POA: Diagnosis not present

## 2021-05-21 DIAGNOSIS — C61 Malignant neoplasm of prostate: Secondary | ICD-10-CM | POA: Diagnosis not present

## 2021-05-22 ENCOUNTER — Telehealth: Payer: PPO | Admitting: Urology

## 2021-05-22 DIAGNOSIS — H353120 Nonexudative age-related macular degeneration, left eye, stage unspecified: Secondary | ICD-10-CM | POA: Diagnosis not present

## 2021-05-22 LAB — PSA: Prostate Specific Ag, Serum: <0.1 ng/mL (ref 0.0–4.0)

## 2021-05-26 ENCOUNTER — Encounter: Payer: Self-pay | Admitting: Urology

## 2021-05-26 ENCOUNTER — Other Ambulatory Visit: Payer: Self-pay

## 2021-05-26 ENCOUNTER — Ambulatory Visit (INDEPENDENT_AMBULATORY_CARE_PROVIDER_SITE_OTHER): Payer: PPO | Admitting: Urology

## 2021-05-26 DIAGNOSIS — C61 Malignant neoplasm of prostate: Secondary | ICD-10-CM | POA: Diagnosis not present

## 2021-05-26 DIAGNOSIS — N5201 Erectile dysfunction due to arterial insufficiency: Secondary | ICD-10-CM

## 2021-05-26 DIAGNOSIS — N401 Enlarged prostate with lower urinary tract symptoms: Secondary | ICD-10-CM | POA: Diagnosis not present

## 2021-05-26 DIAGNOSIS — N138 Other obstructive and reflux uropathy: Secondary | ICD-10-CM

## 2021-05-26 DIAGNOSIS — R351 Nocturia: Secondary | ICD-10-CM | POA: Diagnosis not present

## 2021-05-26 MED ORDER — SILDENAFIL CITRATE 50 MG PO TABS: 50.0000 mg | ORAL_TABLET | ORAL | 3 refills | Status: DC | PRN

## 2021-05-26 NOTE — Progress Notes (Signed)
05/26/2021 3:27 PM   David White May 11, 1951 [id number]  Referring provider: [employer], [place] [place],  [place] [address]  Patient location: home Physician location: office I connected with  David White on 05/26/21 by a video enabled telemedicine application and verified that I am speaking with the correct person using two identifiers.   I discussed the limitations of evaluation and management by telemedicine. The patient expressed understanding and agreed to proceed.    Followup BPH and prostate cancer  HPI: David White is a 70yo here for prostate cancer, BPH, and erectile dysfunction. PSA undetectable. He has stable LUTS on no BPH therapy. Nocturia 0-1x, urine stream strong, no urinary hesitancy. He tried 100mg  sildenafil which gave him semifirm erection. He is not interested in intracavernosal injections. No other complaints.    PMH: Past Medical History:  Diagnosis Date   Anxiety    Arthritis    Hepatitis    hepatitis a as child   Hypercholesteremia    Macular degeneration    left eye   Prostate cancer ([place])    Tobacco abuse     Surgical History: Past Surgical History:  Procedure Laterality Date   APPENDECTOMY     CYSTOSCOPY  04/28/2018   Procedure: CYSTOSCOPY FLEXIBLE;  Surgeon: Cleon Gustin, MD;  Location: [facility];  Service: Urology;;  no seeds found in bladder   EYE SURGERY  10-15 yrs ago   ioc for cataracts   HYDROCELE EXCISION Left 12/25/2020   Procedure: EXCISION OF LEFT EPIDIDYMAL CYST;  Surgeon: Cleon Gustin, MD;  Location: [facility];  Service: Urology;  Laterality: Left;   PROSTATE BIOPSY N/A 01/09/2018   Procedure: BIOPSY TRANSRECTAL ULTRASONIC PROSTATE (TUBP);  Surgeon: Cleon Gustin, MD;  Location: [facility];  Service: Urology;  Laterality: N/A;  [place] IMPLANT N/A 04/28/2018   Procedure: RADIOACTIVE SEED IMPLANT/BRACHYTHERAPY IMPLANT;  Surgeon: Cleon Gustin, MD;   Location: [facility];  Service: Urology;  Laterality: N/A;   78 seeds implanted   removal of cyst     back of head   SHOULDER SURGERY Right    SPACE OAR INSTILLATION N/A 04/28/2018   Procedure: SPACE OAR INSTILLATION;  Surgeon: Cleon Gustin, MD;  Location: [facility];  Service: Urology;  Laterality: N/A;    Home Medications:  Allergies as of 05/26/2021       Reactions   Codeine Other (See Comments)   Headache dizziness        Medication List        Accurate as of May 26, 2021  3:27 PM. If you have any questions, ask your nurse or doctor.          ALPRAZolam 0.5 MG tablet Commonly known as: XANAX Take 1 tablet (0.5 mg total) by mouth 3 (three) times daily as needed for anxiety or sleep.   AMBULATORY NON FORMULARY MEDICATION 0.2 mLs by Intracavernosal route as needed. Medication Name: Trimix  PGE 69mcg Pap 30mg  Phent 1mg    Ciprodex OTIC suspension Generic drug: ciprofloxacin-dexamethasone Place 4-5 drops into both ears 2 (two) times daily as needed (ear infection).   ibuprofen 800 MG tablet Commonly known as: ADVIL Take 800 mg by mouth every 8 (eight) hours as needed for moderate pain.   oxyCODONE-acetaminophen 5-325 MG tablet Commonly known as: Percocet Take 1 tablet by mouth every 4 (four) hours as needed for severe pain.   rosuvastatin 10 MG tablet Commonly known  as: CRESTOR Take 10 mg by mouth at bedtime.   sildenafil 100 MG tablet Commonly known as: VIAGRA Take 1 tablet (100 mg total) by mouth daily as needed for erectile dysfunction.        Allergies:  Allergies  Allergen Reactions   Codeine Other (See Comments)    Headache dizziness    Family History: Family History  Problem Relation Age of Onset   Lung cancer Father    Prostate cancer Neg Hx    Colon cancer Neg Hx    Pancreatic cancer Neg Hx    Breast cancer Neg Hx     Social History:  reports that he has been smoking cigarettes. He has a  60.00 pack-year smoking history. He has never used smokeless tobacco. He reports current alcohol use. He reports that he does not use drugs.  ROS: All other review of systems were reviewed and are negative except what is noted above in HPI   Laboratory Data: Lab Results  Component Value Date   WBC 9.2 12/23/2020   HGB 14.5 12/23/2020   HCT 43.8 12/23/2020   MCV 97.3 12/23/2020   PLT 250 12/23/2020    Lab Results  Component Value Date   CREATININE 0.91 07/05/2020    Lab Results  Component Value Date   PSA 0.2 08/09/2019    Lab Results  Component Value Date   TESTOSTERONE 602 11/14/2020    Lab Results  Component Value Date   HGBA1C 5.4 12/31/2012    Urinalysis    Component Value Date/Time   COLORURINE YELLOW 07/05/2020 1544   APPEARANCEUR Clear 01/09/2021 1309   LABSPEC 1.013 07/05/2020 1544   PHURINE 6.0 07/05/2020 1544   GLUCOSEU Negative 01/09/2021 1309   HGBUR NEGATIVE 07/05/2020 1544   BILIRUBINUR Negative 01/09/2021 1309   KETONESUR 5 (A) 07/05/2020 1544   PROTEINUR Negative 01/09/2021 1309   PROTEINUR NEGATIVE 07/05/2020 1544   NITRITE Negative 01/09/2021 1309   NITRITE NEGATIVE 07/05/2020 1544   LEUKOCYTESUR Negative 01/09/2021 1309   LEUKOCYTESUR NEGATIVE 07/05/2020 1544    Lab Results  Component Value Date   LABMICR Comment 01/09/2021   BACTERIA NONE SEEN 12/01/2017    Pertinent Imaging:  No results found for this or any previous visit.  No results found for this or any previous visit.  No results found for this or any previous visit.  No results found for this or any previous visit.  No results found for this or any previous visit.  No results found for this or any previous visit.  No results found for this or any previous visit.  No results found for this or any previous visit.   Assessment & Plan:    1. Prostate cancer ([place]) -RTC 6 months with PSA  2. Erectile dysfunction due to arterial insufficiency -continue  siildenafil 100mg  prn  3. Nocturia -fluid management prior to going to bed  4. Benign prostatic hyperplasia with urinary obstruction -observation since patient is not bothered by his LUTS   No follow-ups on file.  Nicolette Bang, MD  [facility] [place]

## 2021-05-26 NOTE — Progress Notes (Signed)
Results printed and mailed.   

## 2021-05-26 NOTE — Patient Instructions (Signed)
Prostate Cancer The prostate is a small gland that produces fluid that makes up semen (seminal fluid). It is located below the bladder in men, in front of the rectum. Prostate cancer is the abnormal growth of cells in the prostate gland. What are the causes? The exact cause of this condition is not known. What increases the risk? You are more likely to develop this condition if: You are 69 years of age or older. You have a family history of prostate cancer. You have a family history of breast and ovarian cancer. You have genes that are passed from parent to child (inherited), such as BRCA1 and BRCA2. You have Lynch syndrome. African American men and men of African descent are diagnosed with prostate cancer at higher rates than other men. The reasons for this are not well understood and are likely due to a combination of genetic and environmental factors. What are the signs or symptoms? Symptoms of this condition include: Problems with urination. This may include: A weak or interrupted flow of urine. Trouble starting or stopping urination. Trouble emptying the bladder all the way. The need to urinate more often, especially at night. Blood in urine or semen. Persistent pain or discomfort in the lower back, lower abdomen, or hips. Trouble getting an erection. Weakness or numbness in the legs or feet. How is this diagnosed? This condition can be diagnosed with: A digital rectal exam. For this exam, a health care provider inserts a gloved finger into the rectum to feel the prostate gland. A blood test called a prostate-specific antigen (PSA) test. A procedure in which a sample of tissue is taken from the prostate and checked under a microscope (prostate biopsy). An imaging test called transrectal ultrasonography. Once the condition is diagnosed, tests will be done to determine how far the cancer has spread. This is called staging the cancer. Staging may involve imaging tests, such as a bone  scan, CT scan, PET scan, or MRI. Stages of prostate cancer The stages of prostate cancer are as follows: Stage 1 (I). At this stage, the cancer is found in the prostate only. The cancer is not visible on imaging tests, and it is usually found by accident, such as during prostate surgery. Stage 2 (II). At this stage, the cancer is more advanced than it is in stage 1, but the cancer has not spread outside the prostate. Stage 3 (III). At this stage, the cancer has spread beyond the outer layer of the prostate to nearby tissues. The cancer may be found in the seminal vesicles, which are near the bladder and the prostate. Stage 4 (IV). At this stage, the cancer has spread to other parts of the body, such as the lymph nodes, bones, bladder, rectum, liver, or lungs. Prostate cancer grading Prostate cancer is also graded according to how the cancer cells look under a microscope. This is called the Gleason score and the total score can range from 6-10, indicating how likely it is that the cancer will spread (metastasize) to other parts of the body. The higher the score, the greater the likelihood that the cancer will spread. Gleason 6 or lower: This indicates that the cancer cells look similar to normal prostate cells (well differentiated). Gleason 7: This indicates that the cancer cells look somewhat similar to normal prostate cells (moderately differentiated). Gleason 8, 9, or 10: This indicates that the cancer cells look very different than normal prostate cells (poorly differentiated). How is this treated? Treatment for this condition depends on several factors,  including the stage of the cancer, your age, personal preferences, and your overall health. Talk with your health care provider about treatment options that are recommended for you. Common treatments include: Observation for early stage prostate cancer (active surveillance). This involves having exams, blood tests, and in some cases, more biopsies.  For some men, this is the only treatment needed. Surgery. Types of surgeries include: Open surgery (radical prostatectomy). In this surgery, a larger incision is made to remove the prostate. A laparoscopic radical prostatectomy. This is a surgery to remove the prostate and lymph nodes through several small incisions. It is often referred to as a minimally invasive surgery. A robotic radical prostatectomy. This is laparoscopic surgery to remove the prostate and lymph nodes with the help of robotic arms that are controlled by the surgeon. Cryoablation. This is surgery to freeze and destroy cancer cells. Radiation treatment. Types of radiation treatment include: External beam radiation. This type aims beams of radiation from outside the body at the prostate to destroy cancerous cells. Brachytherapy. This type uses radioactive needles, seeds, wires, or tubes that are implanted into the prostate gland. Like external beam radiation, brachytherapy destroys cancerous cells. An advantage is that this type of radiation limits the damage to surrounding tissue and has fewer side effects. Chemotherapy. This treatment kills cancer cells or stops them from multiplying. It kills both cancer cells and normal cells. Targeted therapy. This treatment uses medicines to kill cancer cells without damaging normal cells. Hormone treatment. This treatment involves taking medicines that act on testosterone, one of the male hormones, by: Stopping your body from producing testosterone. Blocking testosterone from reaching cancer cells. Follow these instructions at home: Lifestyle Do not use any products that contain nicotine or tobacco. These products include cigarettes, chewing tobacco, and vaping devices, such as e-cigarettes. If you need help quitting, ask your health care provider. Eat a healthy diet. To do this: Eat foods that are high in fiber. These include beans, whole grains, and fresh fruits and vegetables. Limit  foods that are high in fat and sugar. These include fried or sweet foods. Treatment for prostate cancer may affect sexual function. If you have a partner, continue to have intimate moments. This may include touching, holding, hugging, and caressing your partner. Get plenty of sleep. Consider joining a support group for men who have prostate cancer. Meeting with a support group may help you learn to manage the stress of having cancer. General instructions Take over-the-counter and prescription medicines only as told by your health care provider. If you have to go to the hospital, notify your cancer specialist (oncologist). Keep all follow-up visits. This is important. Where to find more information American Cancer Society: www.cancer.org American Society of Clinical Oncology: www.cancer.net National Cancer Institute: www.cancer.gov Contact a health care provider if: You have new or increasing trouble urinating. You have new or increasing blood in your urine. You have new or increasing pain in your hips, back, or chest. Get help right away if: You have weakness or numbness in your legs. You cannot control urination or your bowel movements (incontinence). You have chills or a fever. Summary The prostate is a small gland that is involved in the production of semen. It is located below a man's bladder, in front of the rectum. Prostate cancer is the abnormal growth of cells in the prostate gland. Treatment for this condition depends on the stage of the cancer, your age, personal preferences, and your overall health. Talk with your health care provider about treatment   options that are recommended for you. Consider joining a support group for men who have prostate cancer. Meeting with a support group may help you learn to manage the stress of having cancer. This information is not intended to replace advice given to you by your health care provider. Make sure you discuss any questions you have with  your health care provider. Document Revised: 09/24/2020 Document Reviewed: 09/24/2020 Elsevier Patient Education  2022 Elsevier Inc.  

## 2021-05-29 ENCOUNTER — Other Ambulatory Visit: Payer: Self-pay | Admitting: Neurosurgery

## 2021-05-29 ENCOUNTER — Other Ambulatory Visit (HOSPITAL_COMMUNITY): Payer: Self-pay | Admitting: Neurosurgery

## 2021-05-29 DIAGNOSIS — M4802 Spinal stenosis, cervical region: Secondary | ICD-10-CM

## 2021-06-03 ENCOUNTER — Ambulatory Visit: Payer: PPO | Admitting: Urology

## 2021-06-05 DIAGNOSIS — M4802 Spinal stenosis, cervical region: Secondary | ICD-10-CM | POA: Diagnosis not present

## 2021-06-12 ENCOUNTER — Ambulatory Visit (HOSPITAL_COMMUNITY)
Admission: RE | Admit: 2021-06-12 | Discharge: 2021-06-12 | Disposition: A | Discharge status: Home / Self Care | Payer: PPO | Source: Ambulatory Visit | Attending: Neurosurgery | Admitting: Neurosurgery

## 2021-06-12 ENCOUNTER — Other Ambulatory Visit: Payer: Self-pay

## 2021-06-12 DIAGNOSIS — M2578 Osteophyte, vertebrae: Secondary | ICD-10-CM | POA: Diagnosis not present

## 2021-06-12 DIAGNOSIS — M4802 Spinal stenosis, cervical region: Secondary | ICD-10-CM | POA: Insufficient documentation

## 2021-06-25 DIAGNOSIS — M4802 Spinal stenosis, cervical region: Secondary | ICD-10-CM | POA: Diagnosis not present

## 2021-07-17 ENCOUNTER — Other Ambulatory Visit (HOSPITAL_COMMUNITY): Payer: Self-pay | Admitting: Neurosurgery

## 2021-07-17 ENCOUNTER — Other Ambulatory Visit: Payer: Self-pay | Admitting: Neurosurgery

## 2021-07-17 DIAGNOSIS — M6520 Calcific tendinitis, unspecified site: Secondary | ICD-10-CM | POA: Diagnosis not present

## 2021-07-17 DIAGNOSIS — M47812 Spondylosis without myelopathy or radiculopathy, cervical region: Secondary | ICD-10-CM | POA: Diagnosis not present

## 2021-07-17 DIAGNOSIS — M5481 Occipital neuralgia: Secondary | ICD-10-CM | POA: Diagnosis not present

## 2021-07-17 DIAGNOSIS — R030 Elevated blood-pressure reading, without diagnosis of hypertension: Secondary | ICD-10-CM | POA: Diagnosis not present

## 2021-07-24 ENCOUNTER — Other Ambulatory Visit: Payer: Self-pay

## 2021-07-24 ENCOUNTER — Ambulatory Visit (HOSPITAL_COMMUNITY)
Admission: RE | Admit: 2021-07-24 | Discharge: 2021-07-24 | Disposition: A | Discharge status: Home / Self Care | Payer: PPO | Source: Ambulatory Visit | Attending: Neurosurgery | Admitting: Neurosurgery

## 2021-07-24 DIAGNOSIS — M542 Cervicalgia: Secondary | ICD-10-CM | POA: Diagnosis not present

## 2021-07-24 DIAGNOSIS — M6520 Calcific tendinitis, unspecified site: Secondary | ICD-10-CM | POA: Insufficient documentation
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
## 2021-08-07 DIAGNOSIS — M4802 Spinal stenosis, cervical region: Secondary | ICD-10-CM | POA: Diagnosis not present

## 2021-08-07 DIAGNOSIS — M5481 Occipital neuralgia: Secondary | ICD-10-CM | POA: Diagnosis not present

## 2021-08-07 DIAGNOSIS — M47812 Spondylosis without myelopathy or radiculopathy, cervical region: Secondary | ICD-10-CM | POA: Diagnosis not present

## 2021-08-07 DIAGNOSIS — J329 Chronic sinusitis, unspecified: Secondary | ICD-10-CM | POA: Diagnosis not present

## 2021-08-07 DIAGNOSIS — F419 Anxiety disorder, unspecified: Secondary | ICD-10-CM | POA: Diagnosis not present

## 2021-08-07 DIAGNOSIS — Z6821 Body mass index (BMI) 21.0-21.9, adult: Secondary | ICD-10-CM | POA: Diagnosis not present

## 2021-08-07 DIAGNOSIS — N400 Enlarged prostate without lower urinary tract symptoms: Secondary | ICD-10-CM | POA: Diagnosis not present

## 2021-08-13 DIAGNOSIS — M4802 Spinal stenosis, cervical region: Secondary | ICD-10-CM | POA: Diagnosis not present

## 2021-08-14 DIAGNOSIS — M4802 Spinal stenosis, cervical region: Secondary | ICD-10-CM | POA: Diagnosis not present

## 2021-08-14 DIAGNOSIS — M5481 Occipital neuralgia: Secondary | ICD-10-CM | POA: Diagnosis not present

## 2021-08-21 DIAGNOSIS — M4802 Spinal stenosis, cervical region: Secondary | ICD-10-CM | POA: Diagnosis not present

## 2021-08-28 DIAGNOSIS — M4802 Spinal stenosis, cervical region: Secondary | ICD-10-CM | POA: Diagnosis not present

## 2021-09-04 DIAGNOSIS — M4802 Spinal stenosis, cervical region: Secondary | ICD-10-CM | POA: Diagnosis not present

## 2021-09-11 DIAGNOSIS — S83241D Other tear of medial meniscus, current injury, right knee, subsequent encounter: Secondary | ICD-10-CM | POA: Diagnosis not present

## 2021-09-11 DIAGNOSIS — H35371 Puckering of macula, right eye: Secondary | ICD-10-CM | POA: Diagnosis not present

## 2021-09-11 DIAGNOSIS — M4802 Spinal stenosis, cervical region: Secondary | ICD-10-CM | POA: Diagnosis not present

## 2021-09-18 DIAGNOSIS — M5481 Occipital neuralgia: Secondary | ICD-10-CM | POA: Diagnosis not present

## 2021-09-18 DIAGNOSIS — M47812 Spondylosis without myelopathy or radiculopathy, cervical region: Secondary | ICD-10-CM | POA: Diagnosis not present

## 2021-09-25 DIAGNOSIS — M4802 Spinal stenosis, cervical region: Secondary | ICD-10-CM | POA: Diagnosis not present

## 2021-10-02 DIAGNOSIS — M4802 Spinal stenosis, cervical region: Secondary | ICD-10-CM | POA: Diagnosis not present

## 2021-10-09 DIAGNOSIS — M4802 Spinal stenosis, cervical region: Secondary | ICD-10-CM | POA: Diagnosis not present

## 2021-10-16 DIAGNOSIS — M4802 Spinal stenosis, cervical region: Secondary | ICD-10-CM | POA: Diagnosis not present

## 2021-10-23 DIAGNOSIS — M4802 Spinal stenosis, cervical region: Secondary | ICD-10-CM | POA: Diagnosis not present

## 2021-10-30 DIAGNOSIS — M4802 Spinal stenosis, cervical region: Secondary | ICD-10-CM | POA: Diagnosis not present

## 2021-11-06 DIAGNOSIS — M4802 Spinal stenosis, cervical region: Secondary | ICD-10-CM | POA: Diagnosis not present

## 2021-11-13 DIAGNOSIS — M4802 Spinal stenosis, cervical region: Secondary | ICD-10-CM | POA: Diagnosis not present

## 2021-11-19 ENCOUNTER — Other Ambulatory Visit: Payer: PPO

## 2021-11-19 DIAGNOSIS — C61 Malignant neoplasm of prostate: Secondary | ICD-10-CM | POA: Diagnosis not present

## 2021-11-20 ENCOUNTER — Other Ambulatory Visit: Payer: PPO

## 2021-11-20 DIAGNOSIS — M4802 Spinal stenosis, cervical region: Secondary | ICD-10-CM | POA: Diagnosis not present

## 2021-11-20 LAB — PSA: Prostate Specific Ag, Serum: <0.1 ng/mL (ref 0.0–4.0)

## 2021-11-27 ENCOUNTER — Ambulatory Visit: Payer: PPO | Admitting: Urology

## 2021-11-27 ENCOUNTER — Encounter: Payer: Self-pay | Admitting: Urology

## 2021-11-27 VITALS — BP 155/73 | HR 93 | Ht 69.0 in | Wt 155.0 lb

## 2021-11-27 DIAGNOSIS — N5201 Erectile dysfunction due to arterial insufficiency: Secondary | ICD-10-CM | POA: Diagnosis not present

## 2021-11-27 DIAGNOSIS — N138 Other obstructive and reflux uropathy: Secondary | ICD-10-CM

## 2021-11-27 DIAGNOSIS — R351 Nocturia: Secondary | ICD-10-CM | POA: Diagnosis not present

## 2021-11-27 DIAGNOSIS — Z8546 Personal history of malignant neoplasm of prostate: Secondary | ICD-10-CM | POA: Diagnosis not present

## 2021-11-27 DIAGNOSIS — C61 Malignant neoplasm of prostate: Secondary | ICD-10-CM

## 2021-11-27 DIAGNOSIS — N401 Enlarged prostate with lower urinary tract symptoms: Secondary | ICD-10-CM

## 2021-11-27 LAB — URINALYSIS, ROUTINE W REFLEX MICROSCOPIC
Bilirubin, UA: NEGATIVE
Glucose, UA: NEGATIVE
Ketones, UA: NEGATIVE
Leukocytes,UA: NEGATIVE
Nitrite, UA: NEGATIVE
RBC, UA: NEGATIVE
Specific Gravity, UA: 1.025 (ref 1.005–1.030)
Urobilinogen, Ur: 0.2 mg/dL (ref 0.2–1.0)
pH, UA: 7 (ref 5.0–7.5)

## 2021-11-27 LAB — MICROSCOPIC EXAMINATION
Bacteria, UA: NONE SEEN
Epithelial Cells (non renal): NONE SEEN /hpf (ref 0–10)
RBC, Urine: NONE SEEN /hpf (ref 0–2)
Renal Epithel, UA: NONE SEEN /hpf
WBC, UA: NONE SEEN /hpf (ref 0–5)

## 2021-11-27 MED ORDER — SILDENAFIL CITRATE 100 MG PO TABS: 100.0000 mg | ORAL_TABLET | Freq: Every day | ORAL | 5 refills | Status: DC | PRN

## 2021-11-27 MED ORDER — ALFUZOSIN HCL ER 10 MG PO TB24: 10.0000 mg | ORAL_TABLET | Freq: Every day | ORAL | 11 refills | Status: DC

## 2021-11-27 NOTE — Patient Instructions (Signed)

## 2021-11-27 NOTE — Progress Notes (Signed)
11/27/2021 9:39 AM   David White 07-08-1951 [id number]  Referring provider: [employer], MD [address] [place],  [place] [address]  No chief complaint on file.   HPI: David White is a 71yo here for followup for prostate cancer, erectile dysfunction, and BPh with nocturia. PSA remains undetectable. IPSS 10 QOL 3 on no BPH therapy. Nocturia 2x. He has difficulty going back to sleep. Urine stream strong. He has not used sildenafil recently.    PMH: Past Medical History:  Diagnosis Date   Anxiety    Arthritis    Hepatitis    hepatitis a as child   Hypercholesteremia    Macular degeneration    left eye   Prostate cancer ([place])    Tobacco abuse     Surgical History: Past Surgical History:  Procedure Laterality Date   APPENDECTOMY     CYSTOSCOPY  04/28/2018   Procedure: CYSTOSCOPY FLEXIBLE;  Surgeon: Cleon Gustin, MD;  Location: [facility];  Service: Urology;;  no seeds found in bladder   EYE SURGERY  10-15 yrs ago   ioc for cataracts   HYDROCELE EXCISION Left 12/25/2020   Procedure: EXCISION OF LEFT EPIDIDYMAL CYST;  Surgeon: Cleon Gustin, MD;  Location[facility];  Service: Urology;  Laterality: Left;   PROSTATE BIOPSY N/A 01/09/2018   Procedure: BIOPSY TRANSRECTAL ULTRASONIC PROSTATE (TUBP);  Surgeon: Cleon Gustin, MD;  Location: [facility];  Service: Urology;  Laterality: N/A;  [place] IMPLANT N/A 04/28/2018   Procedure: RADIOACTIVE SEED IMPLANT/BRACHYTHERAPY IMPLANT;  Surgeon: Cleon Gustin, MD;  Location[facility];  Service: Urology;  Laterality: N/A;   78 seeds implanted   removal of cyst     back of head   SHOULDER SURGERY Right    SPACE OAR INSTILLATION N/A 04/28/2018   Procedure: SPACE OAR INSTILLATION;  Surgeon: Cleon Gustin, MD;  Location: [facility];  Service: Urology;  Laterality: N/A;    Home Medications:  Allergies as of 11/27/2021       Reactions    Codeine Other (See Comments)   Headache dizziness        Medication List        Accurate as of Nov 27, 2021  9:39 AM. If you have any questions, ask your nurse or doctor.          ALPRAZolam 0.5 MG tablet Commonly known as: XANAX Take 1 tablet (0.5 mg total) by mouth 3 (three) times daily as needed for anxiety or sleep.   AMBULATORY NON FORMULARY MEDICATION 0.2 mLs by Intracavernosal route as needed. Medication Name: Trimix  PGE 36mg Pap '30mg'$  Phent '1mg'$    Ciprodex OTIC suspension Generic drug: ciprofloxacin-dexamethasone Place 4-5 drops into both ears 2 (two) times daily as needed (ear infection).   ibuprofen 800 MG tablet Commonly known as: ADVIL Take 800 mg by mouth every 8 (eight) hours as needed for moderate pain.   oxyCODONE-acetaminophen 5-325 MG tablet Commonly known as: Percocet Take 1 tablet by mouth every 4 (four) hours as needed for severe pain.   rosuvastatin 10 MG tablet Commonly known as: CRESTOR Take 10 mg by mouth at bedtime.   sildenafil 50 MG tablet Commonly known as: Viagra Take 1 tablet (50 mg total) by mouth as needed for erectile dysfunction.        Allergies:  Allergies  Allergen Reactions   Codeine Other (See Comments)    Headache dizziness    Family  History: Family History  Problem Relation Age of Onset   Lung cancer Father    Prostate cancer Neg Hx    Colon cancer Neg Hx    Pancreatic cancer Neg Hx    Breast cancer Neg Hx     Social History:  reports that he has been smoking cigarettes. He has a 60.00 pack-year smoking history. He has never used smokeless tobacco. He reports current alcohol use. He reports that he does not use drugs.  ROS: All other review of systems were reviewed and are negative except what is noted above in HPI  Physical Exam: BP (!) 155/73   Pulse 93   Ht '5\' 9"'$  (1.753 m)   Wt 155 lb (70.3 kg)   BMI 22.89 kg/m   Constitutional:  Alert and oriented, No acute distress. HEENT: David White AT, moist  mucus membranes.  Trachea midline, no masses. Cardiovascular: No clubbing, cyanosis, or edema. Respiratory: Normal respiratory effort, no increased work of breathing. GI: Abdomen is soft, nontender, nondistended, no abdominal masses GU: No CVA tenderness.  Lymph: No cervical or inguinal lymphadenopathy. Skin: No rashes, bruises or suspicious lesions. Neurologic: Grossly intact, no focal deficits, moving all 4 extremities. Psychiatric: Normal mood and affect.  Laboratory Data: Lab Results  Component Value Date   WBC 9.2 12/23/2020   HGB 14.5 12/23/2020   HCT 43.8 12/23/2020   MCV 97.3 12/23/2020   PLT 250 12/23/2020    Lab Results  Component Value Date   CREATININE 0.91 07/05/2020    Lab Results  Component Value Date   PSA 0.2 08/09/2019    Lab Results  Component Value Date   TESTOSTERONE 602 11/14/2020    Lab Results  Component Value Date   HGBA1C 5.4 12/31/2012    Urinalysis    Component Value Date/Time   COLORURINE YELLOW 07/05/2020 1544   APPEARANCEUR Clear 01/09/2021 1309   LABSPEC 1.013 07/05/2020 1544   PHURINE 6.0 07/05/2020 1544   GLUCOSEU Negative 01/09/2021 1309   HGBUR NEGATIVE 07/05/2020 1544   BILIRUBINUR Negative 01/09/2021 1309   KETONESUR 5 (A) 07/05/2020 1544   PROTEINUR Negative 01/09/2021 1309   PROTEINUR NEGATIVE 07/05/2020 1544   NITRITE Negative 01/09/2021 1309   NITRITE NEGATIVE 07/05/2020 1544   LEUKOCYTESUR Negative 01/09/2021 1309   LEUKOCYTESUR NEGATIVE 07/05/2020 1544    Lab Results  Component Value Date   LABMICR Comment 01/09/2021   BACTERIA NONE SEEN 12/01/2017    Pertinent Imaging:  No results found for this or any previous visit.  No results found for this or any previous visit.  No results found for this or any previous visit.  No results found for this or any previous visit.  No results found for this or any previous visit.  No results found for this or any previous visit.  No results found for this or  any previous visit.  No results found for this or any previous visit.   Assessment & Plan:    1. Prostate cancer ([place]) -RTC 1 year with PSA - Urinalysis, Routine w reflex microscopic  2. Erectile dysfunction due to arterial insufficiency -Sildenafil '100mg'$  prn  3. Nocturia -We will trial uroxatral '10mg'$  qhs  4. Benign prostatic hyperplasia with urinary obstruction -Uroxatral '10mg'$  QHS   No follow-ups on file.  Nicolette Bang, MD  [facility] [place]

## 2021-12-04 DIAGNOSIS — M4802 Spinal stenosis, cervical region: Secondary | ICD-10-CM | POA: Diagnosis not present

## 2021-12-17 DIAGNOSIS — M5481 Occipital neuralgia: Secondary | ICD-10-CM | POA: Diagnosis not present

## 2021-12-17 DIAGNOSIS — M47812 Spondylosis without myelopathy or radiculopathy, cervical region: Secondary | ICD-10-CM | POA: Diagnosis not present

## 2021-12-17 DIAGNOSIS — F419 Anxiety disorder, unspecified: Secondary | ICD-10-CM | POA: Diagnosis not present

## 2021-12-17 DIAGNOSIS — M4802 Spinal stenosis, cervical region: Secondary | ICD-10-CM | POA: Diagnosis not present

## 2022-01-01 DIAGNOSIS — M4802 Spinal stenosis, cervical region: Secondary | ICD-10-CM | POA: Diagnosis not present

## 2022-01-22 DIAGNOSIS — M4802 Spinal stenosis, cervical region: Secondary | ICD-10-CM | POA: Diagnosis not present

## 2022-01-29 DIAGNOSIS — M4802 Spinal stenosis, cervical region: Secondary | ICD-10-CM | POA: Diagnosis not present

## 2022-02-26 DIAGNOSIS — S83241D Other tear of medial meniscus, current injury, right knee, subsequent encounter: Secondary | ICD-10-CM | POA: Diagnosis not present

## 2022-04-02 DIAGNOSIS — H6981 Other specified disorders of Eustachian tube, right ear: Secondary | ICD-10-CM | POA: Diagnosis not present

## 2022-04-02 DIAGNOSIS — D518 Other vitamin B12 deficiency anemias: Secondary | ICD-10-CM | POA: Diagnosis not present

## 2022-04-02 DIAGNOSIS — Z6822 Body mass index (BMI) 22.0-22.9, adult: Secondary | ICD-10-CM | POA: Diagnosis not present

## 2022-04-02 DIAGNOSIS — Z125 Encounter for screening for malignant neoplasm of prostate: Secondary | ICD-10-CM | POA: Diagnosis not present

## 2022-04-02 DIAGNOSIS — Z0001 Encounter for general adult medical examination with abnormal findings: Secondary | ICD-10-CM | POA: Diagnosis not present

## 2022-04-02 DIAGNOSIS — N400 Enlarged prostate without lower urinary tract symptoms: Secondary | ICD-10-CM | POA: Diagnosis not present

## 2022-04-02 DIAGNOSIS — E039 Hypothyroidism, unspecified: Secondary | ICD-10-CM | POA: Diagnosis not present

## 2022-04-02 DIAGNOSIS — I700 Atherosclerosis of aorta: Secondary | ICD-10-CM | POA: Diagnosis not present

## 2022-04-02 DIAGNOSIS — E559 Vitamin D deficiency, unspecified: Secondary | ICD-10-CM | POA: Diagnosis not present

## 2022-04-02 DIAGNOSIS — Z1331 Encounter for screening for depression: Secondary | ICD-10-CM | POA: Diagnosis not present

## 2022-06-11 DIAGNOSIS — M47812 Spondylosis without myelopathy or radiculopathy, cervical region: Secondary | ICD-10-CM | POA: Diagnosis not present

## 2022-11-19 ENCOUNTER — Other Ambulatory Visit: Payer: PPO

## 2022-11-19 DIAGNOSIS — C61 Malignant neoplasm of prostate: Secondary | ICD-10-CM | POA: Diagnosis not present

## 2022-11-20 LAB — PSA: Prostate Specific Ag, Serum: <0.1 ng/mL (ref 0.0–4.0)

## 2022-11-26 ENCOUNTER — Ambulatory Visit: Payer: PPO | Admitting: Urology

## 2022-11-26 VITALS — BP 149/61 | HR 88

## 2022-11-26 DIAGNOSIS — R351 Nocturia: Secondary | ICD-10-CM

## 2022-11-26 DIAGNOSIS — A630 Anogenital (venereal) warts: Secondary | ICD-10-CM | POA: Diagnosis not present

## 2022-11-26 DIAGNOSIS — C61 Malignant neoplasm of prostate: Secondary | ICD-10-CM | POA: Diagnosis not present

## 2022-11-26 DIAGNOSIS — N401 Enlarged prostate with lower urinary tract symptoms: Secondary | ICD-10-CM

## 2022-11-26 DIAGNOSIS — N138 Other obstructive and reflux uropathy: Secondary | ICD-10-CM | POA: Diagnosis not present

## 2022-11-26 MED ORDER — ALFUZOSIN HCL ER 10 MG PO TB24: 10.0000 mg | ORAL_TABLET | Freq: Every day | ORAL | 11 refills | Status: DC

## 2022-11-26 MED ORDER — PODOFILOX 0.5 % EX GEL: Freq: Two times a day (BID) | CUTANEOUS | 0 refills | Status: AC

## 2022-11-26 NOTE — Progress Notes (Unsigned)
11/26/2022 10:17 AM   David White 01-18-1951 [id number]  Referring provider: Elfredia Nevins, MD [address] [place],  [place] [address]   Followup prostate cancer and BPH  HPI: [name] is a 71yo here for followup for prostate cancer and BPH. PSA undetectable. IPSS 5 QOl 1 on uroxatral 10mg . Uirne stream strong. Nocturia 0-1x. No straining to urinate. He notes a new gential wart on his foreskin over the past several months   PMH: Past Medical History:  Diagnosis Date   Anxiety    Arthritis    Hepatitis    hepatitis a as child   Hypercholesteremia    Macular degeneration    left eye   Prostate cancer (HCC)    Tobacco abuse     Surgical History: Past Surgical History:  Procedure Laterality Date   APPENDECTOMY     CYSTOSCOPY  04/28/2018   Procedure: CYSTOSCOPY FLEXIBLE;  Surgeon: David Gauze, MD;  Location: [facility];  Service: Urology;;  no seeds found in bladder   EYE SURGERY  10-15 yrs ago   ioc for cataracts   HYDROCELE EXCISION Left 12/25/2020   Procedure: EXCISION OF LEFT EPIDIDYMAL CYST;  Surgeon: David Gauze, MD;  Location: [facility];  Service: Urology;  Laterality: Left;   PROSTATE BIOPSY N/A 01/09/2018   Procedure: BIOPSY TRANSRECTAL ULTRASONIC PROSTATE (TUBP);  Surgeon: David Gauze, MD;  Location: [facility];  Service: Urology;  Laterality: N/A;  30 MINS   RADIOACTIVE SEED IMPLANT N/A 04/28/2018   Procedure: RADIOACTIVE SEED IMPLANT/BRACHYTHERAPY IMPLANT;  Surgeon: David Gauze, MD;  Location: [facility];  Service: Urology;  Laterality: N/A;   78 seeds implanted   removal of cyst     back of head   SHOULDER SURGERY Right    SPACE OAR INSTILLATION N/A 04/28/2018   Procedure: SPACE OAR INSTILLATION;  Surgeon: David Gauze, MD;  Location: [facility];  Service: Urology;  Laterality: N/A;    Home Medications:  Allergies as of 11/26/2022       Reactions   Codeine Other  (See Comments)   Headache dizziness        Medication List        Accurate as of Nov 26, 2022 10:17 AM. If you have any questions, ask your nurse or doctor.          alfuzosin 10 MG 24 hr tablet Commonly known as: UROXATRAL Take 1 tablet (10 mg total) by mouth at bedtime.   ALPRAZolam 0.5 MG tablet Commonly known as: XANAX Take 1 tablet (0.5 mg total) by mouth 3 (three) times daily as needed for anxiety or sleep.   AMBULATORY NON FORMULARY MEDICATION 0.2 mLs by Intracavernosal route as needed. Medication Name: Trimix  PGE Pap 30mg  Phent 1mg    Ciprodex OTIC suspension Generic drug: ciprofloxacin-dexamethasone Place 4-5 drops into both ears 2 (two) times daily as needed (ear infection).   ibuprofen 800 MG tablet Commonly known as: ADVIL Take 800 mg by mouth every 8 (eight) hours as needed for moderate pain.   rosuvastatin 10 MG tablet Commonly known as: CRESTOR Take 10 mg by mouth at bedtime.   sildenafil 100 MG tablet Commonly known as: VIAGRA Take 1 tablet (100 mg total) by mouth daily as needed for erectile dysfunction.        Allergies:  Allergies  Allergen Reactions   Codeine Other (See Comments)    Headache dizziness    Family History: Family History  Problem Relation Age of Onset   Lung cancer Father    Prostate cancer Neg Hx    Colon cancer Neg Hx    Pancreatic cancer Neg Hx    Breast cancer Neg Hx     Social History:  reports that he has been smoking cigarettes. He has a 60.00 pack-year smoking history. He has never used smokeless tobacco. He reports current alcohol use. He reports that he does not use drugs.  ROS: All other review of systems were reviewed and are negative except what is noted above in HPI  Physical Exam: BP (!) 149/61   Pulse 88   Constitutional:  Alert and oriented, No acute distress. HEENT: [place] AT, moist mucus membranes.  Trachea midline, no masses. Cardiovascular: No clubbing, cyanosis, or  edema. Respiratory: Normal respiratory effort, no increased work of breathing. GI: Abdomen is soft, nontender, nondistended, no abdominal masses GU: No CVA tenderness.  Lymph: No cervical or inguinal lymphadenopathy. Skin: No rashes, bruises or suspicious lesions. Neurologic: Grossly intact, no focal deficits, moving all 4 extremities. Psychiatric: Normal mood and affect.  Laboratory Data: Lab Results  Component Value Date   WBC 9.2 12/23/2020   HGB 14.5 12/23/2020   HCT 43.8 12/23/2020   MCV 97.3 12/23/2020   PLT 250 12/23/2020    Lab Results  Component Value Date   CREATININE 0.91 07/05/2020    Lab Results  Component Value Date   PSA 0.2 08/09/2019    Lab Results  Component Value Date   TESTOSTERONE 602 11/14/2020    Lab Results  Component Value Date   HGBA1C 5.4 12/31/2012    Urinalysis    Component Value Date/Time   COLORURINE YELLOW 07/05/2020 1544   APPEARANCEUR Clear 11/27/2021 0922   LABSPEC 1.013 07/05/2020 1544   PHURINE 6.0 07/05/2020 1544   GLUCOSEU Negative 11/27/2021 0922   HGBUR NEGATIVE 07/05/2020 1544   BILIRUBINUR Negative 11/27/2021 0922   KETONESUR 5 (A) 07/05/2020 1544   PROTEINUR 2+ (A) 11/27/2021 0922   PROTEINUR NEGATIVE 07/05/2020 1544   NITRITE Negative 11/27/2021 0922   NITRITE NEGATIVE 07/05/2020 1544   LEUKOCYTESUR Negative 11/27/2021 0922   LEUKOCYTESUR NEGATIVE 07/05/2020 1544    Lab Results  Component Value Date   LABMICR See below: 11/27/2021   WBCUA None seen 11/27/2021   LABEPIT None seen 11/27/2021   MUCUS Present 11/27/2021   BACTERIA None seen 11/27/2021    Pertinent Imaging:  No results found for this or any previous visit.  No results found for this or any previous visit.  No results found for this or any previous visit.  No results found for this or any previous visit.  No results found for this or any previous visit.  No valid procedures specified. No results found for this or any previous  visit.  No results found for this or any previous visit.   Assessment & Plan:    1. Prostate cancer (HCC) Followup 1 year with PSA - Urinalysis, Routine w reflex microscopic  2. Erectile dysfunction due to arterial insufficiency -patient defers therapy at this time  3. Nocturia -uroxatral 10mg  daily  4. Benign prostatic hyperplasia with urinary obstruction -Uroxatral 10mg  daily  4. Condyloma  -condylox for 4 weeks   No follow-ups on file.  Wilkie Aye, MD  [facility] [place]

## 2022-11-30 ENCOUNTER — Encounter: Payer: Self-pay | Admitting: Urology

## 2022-11-30 NOTE — Patient Instructions (Signed)
Genital Warts Genital warts are a common sexually transmitted infection (STI). They can be easily passed from person to person (transmitted). The warts may appear as small bumps on the skin near the genitals and the opening of the butt (anus). They are caused by an infection with a type of the virus human papillomavirus (HPV). What are the causes? This condition is caused by HPV. HPV is spread by having sex without protection with someone who has the infection. It can be spread through vaginal, anal, or oral sex. What increases the risk? You are more likely to get genital warts if: You have direct contact with a person with HPV. You have sex without using protection. This includes oral, vaginal, and anal sex. You have more than one sexual partner. You are male and not circumcised. You have a male sexual partner who is not circumcised. You have a weak body defense system (immune system). This may be because of a disease or medicine. What are the signs or symptoms? Many people do not know that they are infected with HPV or have genital warts. Symptoms of genital warts may include: Small growths around the groin, genitals, or anus. The growths may vary in size and shape. They may be flat, dome-shaped, or shaped like cauliflower. They are often the same color as your skin. Tell your health care provider if you have: Irritated skin near the genitals or anus. This includes on the groin. You may have itching, pain, or bleeding. Genital warts that look odd, bleed, turn into open sores (ulcers), or change in color. Pain during sex. How is this diagnosed? Genital warts may be diagnosed based on your symptoms and a physical exam. You may also have other tests, such as: A biopsy. This is when a piece of tissue is removed for testing and looked at under a microscope. In females: An HPV test is similar to a Pap test. It involves taking cells from the cervix. It may be done at the same time as a Pap  test. Colposcopy. This is a test done to look at the vagina and the lowest part of the uterus (cervix). How is this treated? There is no treatment for HPV. But there are treatments for the warts caused by HPV. You may need: Medicines. These include solutions or creams that you apply to your skin (topical). Procedures to remove the warts. These include: Cryotherapy. This is when the warts are frozen off with liquid nitrogen. Burning the warts with a laser (laser treatment) or electric probe (electrocautery). Surgery. If left untreated, the warts may go away on their own, stay the same, or increase in size and number. Follow these instructions at home: Medicines  Apply over-the-counter and prescription medicines only as told by your provider. Do not treat genital warts with medicines that are used for treating hand or foot warts. Talk with your provider about using over-the-counter creams to treat itching. General instructions Do not touch or scratch the warts. Tell your current and past sexual partners about the warts. They may also need treatment. If you are male, get regular Pap and HPV tests as told by your provider. If you become pregnant, tell your provider that you have had genital warts. They will need to watch you closely during pregnancy. Warts may spread or grow faster when you are pregnant. In rare cases, warts may spread to a baby during birth. Keep all follow-up visits. Your provider will watch you closely. Some types of HPV increase the risk for cervical   cancer. How is this prevented? To prevent genital warts: Use a new latex or polyurethane condom for each sex act. Limit how many sexual partners you have. Have a sex partner who does not have other sex partners. Talk with your sexual partners about their health. Get the HPV vaccine. It is recommended for people who are 9-26 years old. It can prevent infection with the types of HPV that are linked to genital warts and  cancer. Where to find more information Centers for Disease Control and Prevention (CDC): cdc.gov Contact a health care provider if: You have redness, swelling, or pain where your skin is being treated for the warts. You have irritation or itching in your groin, genitals, or anus. You feel lumps in or around your genitals or anus. You have bleeding near your genitals or anus. You have pain during sex, or you bleed after sex. This information is not intended to replace advice given to you by your health care provider. Make sure you discuss any questions you have with your health care provider. Document Revised: 02/17/2022 Document Reviewed: 02/17/2022 Elsevier Patient Education  2023 Elsevier Inc.  

## 2022-12-22 DIAGNOSIS — M4802 Spinal stenosis, cervical region: Secondary | ICD-10-CM | POA: Diagnosis not present

## 2022-12-22 DIAGNOSIS — M47812 Spondylosis without myelopathy or radiculopathy, cervical region: Secondary | ICD-10-CM | POA: Diagnosis not present

## 2022-12-22 DIAGNOSIS — N400 Enlarged prostate without lower urinary tract symptoms: Secondary | ICD-10-CM | POA: Diagnosis not present

## 2022-12-22 DIAGNOSIS — F419 Anxiety disorder, unspecified: Secondary | ICD-10-CM | POA: Diagnosis not present

## 2022-12-22 DIAGNOSIS — I700 Atherosclerosis of aorta: Secondary | ICD-10-CM | POA: Diagnosis not present

## 2023-04-15 DIAGNOSIS — Z9229 Personal history of other drug therapy: Secondary | ICD-10-CM | POA: Diagnosis not present

## 2023-04-15 DIAGNOSIS — M47812 Spondylosis without myelopathy or radiculopathy, cervical region: Secondary | ICD-10-CM | POA: Diagnosis not present

## 2023-04-15 DIAGNOSIS — Z6821 Body mass index (BMI) 21.0-21.9, adult: Secondary | ICD-10-CM | POA: Diagnosis not present

## 2023-04-15 DIAGNOSIS — I700 Atherosclerosis of aorta: Secondary | ICD-10-CM | POA: Diagnosis not present

## 2023-04-15 DIAGNOSIS — N401 Enlarged prostate with lower urinary tract symptoms: Secondary | ICD-10-CM | POA: Diagnosis not present

## 2023-04-15 DIAGNOSIS — M4802 Spinal stenosis, cervical region: Secondary | ICD-10-CM | POA: Diagnosis not present

## 2023-04-15 DIAGNOSIS — Z1331 Encounter for screening for depression: Secondary | ICD-10-CM | POA: Diagnosis not present

## 2023-04-15 DIAGNOSIS — Z0001 Encounter for general adult medical examination with abnormal findings: Secondary | ICD-10-CM | POA: Diagnosis not present

## 2023-04-15 DIAGNOSIS — N400 Enlarged prostate without lower urinary tract symptoms: Secondary | ICD-10-CM | POA: Diagnosis not present

## 2023-04-15 DIAGNOSIS — Z125 Encounter for screening for malignant neoplasm of prostate: Secondary | ICD-10-CM | POA: Diagnosis not present

## 2023-08-20 DIAGNOSIS — Z6821 Body mass index (BMI) 21.0-21.9, adult: Secondary | ICD-10-CM | POA: Diagnosis not present

## 2023-08-20 DIAGNOSIS — J069 Acute upper respiratory infection, unspecified: Secondary | ICD-10-CM | POA: Diagnosis not present

## 2023-11-23 ENCOUNTER — Other Ambulatory Visit: Payer: PPO

## 2023-11-24 ENCOUNTER — Telehealth: Payer: Self-pay | Admitting: Urology

## 2023-11-24 DIAGNOSIS — J449 Chronic obstructive pulmonary disease, unspecified: Secondary | ICD-10-CM | POA: Diagnosis not present

## 2023-11-24 DIAGNOSIS — L219 Seborrheic dermatitis, unspecified: Secondary | ICD-10-CM | POA: Diagnosis not present

## 2023-11-24 DIAGNOSIS — F419 Anxiety disorder, unspecified: Secondary | ICD-10-CM | POA: Diagnosis not present

## 2023-11-24 DIAGNOSIS — C61 Malignant neoplasm of prostate: Secondary | ICD-10-CM | POA: Diagnosis not present

## 2023-11-24 DIAGNOSIS — M47812 Spondylosis without myelopathy or radiculopathy, cervical region: Secondary | ICD-10-CM | POA: Diagnosis not present

## 2023-11-24 NOTE — Telephone Encounter (Signed)
 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

## 2023-11-25 LAB — PSA: Prostate Specific Ag, Serum: <0.1 ng/mL (ref 0.0–4.0)

## 2023-11-30 ENCOUNTER — Ambulatory Visit: Payer: PPO | Admitting: Urology

## 2023-12-23 DIAGNOSIS — N401 Enlarged prostate with lower urinary tract symptoms: Secondary | ICD-10-CM | POA: Diagnosis not present

## 2023-12-23 DIAGNOSIS — Z9229 Personal history of other drug therapy: Secondary | ICD-10-CM | POA: Diagnosis not present

## 2023-12-23 DIAGNOSIS — Z6821 Body mass index (BMI) 21.0-21.9, adult: Secondary | ICD-10-CM | POA: Diagnosis not present

## 2023-12-23 DIAGNOSIS — L0100 Impetigo, unspecified: Secondary | ICD-10-CM | POA: Diagnosis not present

## 2023-12-23 DIAGNOSIS — N400 Enlarged prostate without lower urinary tract symptoms: Secondary | ICD-10-CM | POA: Diagnosis not present

## 2023-12-23 DIAGNOSIS — L219 Seborrheic dermatitis, unspecified: Secondary | ICD-10-CM | POA: Diagnosis not present

## 2023-12-23 DIAGNOSIS — R5383 Other fatigue: Secondary | ICD-10-CM | POA: Diagnosis not present

## 2023-12-23 DIAGNOSIS — J449 Chronic obstructive pulmonary disease, unspecified: Secondary | ICD-10-CM | POA: Diagnosis not present

## 2023-12-23 DIAGNOSIS — Z0001 Encounter for general adult medical examination with abnormal findings: Secondary | ICD-10-CM | POA: Diagnosis not present

## 2024-01-12 DIAGNOSIS — D729 Disorder of white blood cells, unspecified: Secondary | ICD-10-CM | POA: Diagnosis not present

## 2024-01-19 DIAGNOSIS — L400 Psoriasis vulgaris: Secondary | ICD-10-CM | POA: Diagnosis not present

## 2024-02-07 DIAGNOSIS — M5412 Radiculopathy, cervical region: Secondary | ICD-10-CM | POA: Diagnosis not present

## 2024-02-07 DIAGNOSIS — M47812 Spondylosis without myelopathy or radiculopathy, cervical region: Secondary | ICD-10-CM | POA: Diagnosis not present

## 2024-02-07 DIAGNOSIS — Z6820 Body mass index (BMI) 20.0-20.9, adult: Secondary | ICD-10-CM | POA: Diagnosis not present

## 2024-02-07 DIAGNOSIS — M4802 Spinal stenosis, cervical region: Secondary | ICD-10-CM | POA: Diagnosis not present

## 2024-02-07 DIAGNOSIS — J449 Chronic obstructive pulmonary disease, unspecified: Secondary | ICD-10-CM | POA: Diagnosis not present

## 2024-02-07 DIAGNOSIS — L723 Sebaceous cyst: Secondary | ICD-10-CM | POA: Diagnosis not present

## 2024-02-10 ENCOUNTER — Encounter: Payer: Self-pay | Admitting: Urology

## 2024-02-10 ENCOUNTER — Ambulatory Visit: Admitting: Urology

## 2024-02-10 DIAGNOSIS — R351 Nocturia: Secondary | ICD-10-CM | POA: Diagnosis not present

## 2024-02-10 DIAGNOSIS — C61 Malignant neoplasm of prostate: Secondary | ICD-10-CM | POA: Diagnosis not present

## 2024-02-10 DIAGNOSIS — N401 Enlarged prostate with lower urinary tract symptoms: Secondary | ICD-10-CM | POA: Diagnosis not present

## 2024-02-10 DIAGNOSIS — N138 Other obstructive and reflux uropathy: Secondary | ICD-10-CM

## 2024-02-10 MED ORDER — SILDENAFIL CITRATE 100 MG PO TABS: 100.0000 mg | ORAL_TABLET | Freq: Every day | ORAL | 5 refills | Status: AC | PRN

## 2024-02-10 MED ORDER — ALFUZOSIN HCL ER 10 MG PO TB24: 10.0000 mg | ORAL_TABLET | Freq: Every day | ORAL | 11 refills | Status: AC

## 2024-02-10 NOTE — Progress Notes (Signed)
 02/10/2024 12:58 PM   David White [birth year] [id number]  Referring provider: Bertell Satterfield, MD [address] [place],  [place] [address]  Patient location: home Physician location: office I connected with  ERIKA HUSSAR on 02/10/24 by a video enabled telemedicine application and verified that I am speaking with the correct person using two identifiers.   I discussed the limitations of evaluation and management by telemedicine. The patient expressed understanding and agreed to proceed.     HPI: Mr Borba is a 73yo here for followup for prostate cancer. PSA remains undetectable. IPSS 4 QOL 1 on uroxatral  10mg  at bedtime. Nocturia 1-2x depending on fluid management. Urine stream strong. No straining to urinate.    PMH: Past Medical History:  Diagnosis Date   Anxiety    Arthritis    Hepatitis    hepatitis a as child   Hypercholesteremia    Macular degeneration    left eye   Prostate cancer (HCC)    Tobacco abuse     Surgical History: Past Surgical History:  Procedure Laterality Date   APPENDECTOMY     CYSTOSCOPY  04/28/2018   Procedure: CYSTOSCOPY FLEXIBLE;  Surgeon: Sherrilee Belvie CROME, MD;  Location: [facility];  Service: Urology;;  no seeds found in bladder   EYE SURGERY  10-15 yrs ago   ioc for cataracts   HYDROCELE EXCISION Left 12/25/2020   Procedure: EXCISION OF LEFT EPIDIDYMAL CYST;  Surgeon: Sherrilee Belvie CROME, MD;  Location: [facility];  Service: Urology;  Laterality: Left;   PROSTATE BIOPSY N/A 01/09/2018   Procedure: BIOPSY TRANSRECTAL ULTRASONIC PROSTATE (TUBP);  Surgeon: Sherrilee Belvie CROME, MD;  Location: [facility];  Service: Urology;  Laterality: N/A;  30 MINS   RADIOACTIVE SEED IMPLANT N/A 04/28/2018   Procedure: RADIOACTIVE SEED IMPLANT/BRACHYTHERAPY IMPLANT;  Surgeon: Sherrilee Belvie CROME, MD;  Location: [facility];  Service: Urology;  Laterality: N/A;   78 seeds implanted   removal of cyst     back of head    SHOULDER SURGERY Right    SPACE OAR INSTILLATION N/A 04/28/2018   Procedure: SPACE OAR INSTILLATION;  Surgeon: Sherrilee Belvie CROME, MD;  Location: [facility];  Service: Urology;  Laterality: N/A;    Home Medications:  Allergies as of 02/10/2024       Reactions   Codeine Other (See Comments)   Headache dizziness        Medication List        Accurate as of February 10, 2024 12:58 PM. If you have any questions, ask your nurse or doctor.          alfuzosin  10 MG 24 hr tablet Commonly known as: UROXATRAL  Take 1 tablet (10 mg total) by mouth at bedtime.   ALPRAZolam  0.5 MG tablet Commonly known as: XANAX  Take 1 tablet (0.5 mg total) by mouth 3 (three) times daily as needed for anxiety or sleep.   AMBULATORY NON FORMULARY MEDICATION 0.2 mLs by Intracavernosal route as needed. Medication Name: Trimix  PGE 30mcg Pap 30mg  Phent 1mg    Ciprodex  OTIC suspension Generic drug: ciprofloxacin -dexamethasone  Place 4-5 drops into both ears 2 (two) times daily as needed (ear infection).   ibuprofen  800 MG tablet Commonly known as: ADVIL  Take 800 mg by mouth every 8 (eight) hours as needed for moderate pain.   podofilox  0.5 % gel Commonly known as: Condylox  Apply topically 2 (two) times daily. Apply BID for 3 days then do not apply for 4 days.  Restart cycle with BID for 3 days then do not apply for 4 days. Patient can apply up to 4 cycles.   rosuvastatin  10 MG tablet Commonly known as: CRESTOR  Take 10 mg by mouth at bedtime.   sildenafil  100 MG tablet Commonly known as: VIAGRA  Take 1 tablet (100 mg total) by mouth daily as needed for erectile dysfunction.        Allergies:  Allergies  Allergen Reactions   Codeine Other (See Comments)    Headache dizziness    Family History: Family History  Problem Relation Age of Onset   Lung cancer Father    Prostate cancer Neg Hx    Colon cancer Neg Hx    Pancreatic cancer Neg Hx    Breast cancer Neg Hx      Social History:  reports that he has been smoking cigarettes. He has a 60 pack-year smoking history. He has never used smokeless tobacco. He reports current alcohol use. He reports that he does not use drugs.  ROS: All other review of systems were reviewed and are negative except what is noted above in HPI   Laboratory Data: Lab Results  Component Value Date   WBC 9.2 12/23/2020   HGB 14.5 12/23/2020   HCT 43.8 12/23/2020   MCV 97.3 12/23/2020   PLT 250 12/23/2020    Lab Results  Component Value Date   CREATININE 0.91 07/05/2020    Lab Results  Component Value Date   PSA 0.2 08/09/2019    Lab Results  Component Value Date   TESTOSTERONE  602 11/14/2020    Lab Results  Component Value Date   HGBA1C 5.4 12/31/2012    Urinalysis    Component Value Date/Time   COLORURINE YELLOW 07/05/2020 1544   APPEARANCEUR Clear 11/27/2021 0922   LABSPEC 1.013 07/05/2020 1544   PHURINE 6.0 07/05/2020 1544   GLUCOSEU Negative 11/27/2021 0922   HGBUR NEGATIVE 07/05/2020 1544   BILIRUBINUR Negative 11/27/2021 0922   KETONESUR 5 (A) 07/05/2020 1544   PROTEINUR 2+ (A) 11/27/2021 0922   PROTEINUR NEGATIVE 07/05/2020 1544   NITRITE Negative 11/27/2021 0922   NITRITE NEGATIVE 07/05/2020 1544   LEUKOCYTESUR Negative 11/27/2021 0922   LEUKOCYTESUR NEGATIVE 07/05/2020 1544    Lab Results  Component Value Date   LABMICR See below: 11/27/2021   WBCUA None seen 11/27/2021   LABEPIT None seen 11/27/2021   MUCUS Present 11/27/2021   BACTERIA None seen 11/27/2021    Pertinent Imaging:  No results found for this or any previous visit.  No results found for this or any previous visit.  No results found for this or any previous visit.  No results found for this or any previous visit.  No results found for this or any previous visit.  No results found for this or any previous visit.  No results found for this or any previous visit.  No results found for this or any  previous visit.   Assessment & Plan:    1. Prostate cancer (HCC) (Primary) Followup 1 year with a PSA  2. BPH the nocturia -continue uroxatral  10mg  qhs   No follow-ups on file.  Belvie Clara, MD  [facility] 

## 2024-02-10 NOTE — Patient Instructions (Signed)

## 2024-02-23 DIAGNOSIS — L400 Psoriasis vulgaris: Secondary | ICD-10-CM | POA: Diagnosis not present

## 2024-03-15 ENCOUNTER — Ambulatory Visit: Attending: Internal Medicine | Admitting: Internal Medicine

## 2024-03-15 ENCOUNTER — Encounter: Payer: Self-pay | Admitting: Internal Medicine

## 2024-03-15 VITALS — BP 138/64 | HR 92 | Ht 69.5 in | Wt 148.8 lb

## 2024-03-15 DIAGNOSIS — R209 Unspecified disturbances of skin sensation: Secondary | ICD-10-CM | POA: Diagnosis not present

## 2024-03-15 DIAGNOSIS — R011 Cardiac murmur, unspecified: Secondary | ICD-10-CM | POA: Diagnosis not present

## 2024-03-15 DIAGNOSIS — R079 Chest pain, unspecified: Secondary | ICD-10-CM | POA: Diagnosis not present

## 2024-03-15 NOTE — Progress Notes (Signed)
 Cardiology Office Note  Date: 03/15/2024   ID: David White, DOB [birth year], MRN [id number]  PCP:  Bertell Satterfield, MD  Cardiologist:  Diannah SHAUNNA Maywood, MD Electrophysiologist:  None   History of Present Illness: David White is a 73 y.o. male  Referred to cardiology clinic for evaluation of chest pain.  Patient had chest pain few times in the last 1 months to 1.5 months.  This chest pain lasted for about 30 seconds to a minute.  It is sharp and stabbing in quality.  It occurs at rest and occasionally with exertion.  He is extremely physically active at baseline.  He does yard work, does household chores, extreme strenuous activities with no symptoms of angina or DOE.  No DOE, dizziness, syncope, lightheadedness, leg swelling.  He does have cold feet according to the wife.  He does have cramps in his legs when he walks sometimes.  Past Medical History:  Diagnosis Date   Anxiety    Arthritis    Hepatitis    hepatitis a as child   Hypercholesteremia    Macular degeneration    left eye   Prostate cancer (HCC)    Tobacco abuse     Past Surgical History:  Procedure Laterality Date   APPENDECTOMY     CYSTOSCOPY  04/28/2018   Procedure: CYSTOSCOPY FLEXIBLE;  Surgeon: Sherrilee Belvie CROME, MD;  Location: [facility];  Service: Urology;;  no seeds found in bladder   EYE SURGERY  10-15 yrs ago   ioc for cataracts   HYDROCELE EXCISION Left 12/25/2020   Procedure: EXCISION OF LEFT EPIDIDYMAL CYST;  Surgeon: Sherrilee Belvie CROME, MD;  Location: [facility];  Service: Urology;  Laterality: Left;   PROSTATE BIOPSY N/A 01/09/2018   Procedure: BIOPSY TRANSRECTAL ULTRASONIC PROSTATE (TUBP);  Surgeon: Sherrilee Belvie CROME, MD;  Location: [facility];  Service: Urology;  Laterality: N/A;  30 MINS   RADIOACTIVE SEED IMPLANT N/A 04/28/2018   Procedure: RADIOACTIVE SEED IMPLANT/BRACHYTHERAPY IMPLANT;  Surgeon: Sherrilee Belvie CROME, MD;  [facility];  Service:  Urology;  Laterality: N/A;   78 seeds implanted   removal of cyst     back of head   SHOULDER SURGERY Right    SPACE OAR INSTILLATION N/A 04/28/2018   Procedure: SPACE OAR INSTILLATION;  Surgeon: Sherrilee Belvie CROME, MD;  Location: [facility];  Service: Urology;  Laterality: N/A;    Current Outpatient Medications  Medication Sig Dispense Refill   alfuzosin  (UROXATRAL ) 10 MG 24 hr tablet Take 1 tablet (10 mg total) by mouth at bedtime. 30 tablet 11   ALPRAZolam  (XANAX ) 0.5 MG tablet Take 1 tablet (0.5 mg total) by mouth 3 (three) times daily as needed for anxiety or sleep. 20 tablet 0   AMBULATORY NON FORMULARY MEDICATION 0.2 mLs by Intracavernosal route as needed. Medication Name: Trimix  PGE 30mcg Pap 30mg  Phent 1mg  5 mL 5   ibuprofen  (ADVIL ,MOTRIN ) 800 MG tablet Take 800 mg by mouth every 8 (eight) hours as needed for moderate pain.     podofilox  (CONDYLOX ) 0.5 % gel Apply topically 2 (two) times daily. Apply BID for 3 days then do not apply for 4 days. Restart cycle with BID for 3 days then do not apply for 4 days. Patient can apply up to 4 cycles. 3.5 g 0   rosuvastatin  (CRESTOR ) 10 MG tablet Take 10 mg by mouth at bedtime.     sildenafil  (VIAGRA ) 100 MG tablet Take 1  tablet (100 mg total) by mouth daily as needed for erectile dysfunction. 30 tablet 5   No current facility-administered medications for this visit.   Allergies:  Codeine   Social History: The patient  reports that he has been smoking cigarettes. He has a 60 pack-year smoking history. He has never used smokeless tobacco. He reports current alcohol use. He reports that he does not use drugs.   Family History: The patient's family history includes Lung cancer in his father.   ROS:  Please see the history of present illness. Otherwise, complete review of systems is positive for none  All other systems are reviewed and negative.   Physical Exam: VS:  BP 138/64   Pulse 92   Ht 5' 9.5 (1.765 m)   Wt 148  lb 12.8 oz (67.5 kg)   SpO2 95%   BMI 21.66 kg/m , BMI Body mass index is 21.66 kg/m.  Wt Readings from Last 3 Encounters:  03/15/24 148 lb 12.8 oz (67.5 kg)  11/27/21 155 lb (70.3 kg)  12/23/20 160 lb (72.6 kg)    General: Patient appears comfortable at rest. HEENT: Conjunctiva and lids normal, oropharynx clear with moist mucosa. Neck: Supple, no elevated JVP or carotid bruits, no thyromegaly. Lungs: Clear to auscultation, nonlabored breathing at rest. Cardiac: Regular rate and rhythm, systolic murmur Abdomen: Soft, nontender, no hepatomegaly, bowel sounds present, no guarding or rebound. Extremities: No pitting edema, distal pulses 2+. Skin: Warm and dry. Musculoskeletal: No kyphosis. Neuropsychiatric: Alert and oriented x3, affect grossly appropriate.  Recent Labwork: No results found for requested labs within last 365 days.  No results found for: CHOL, TRIG, HDL, CHOLHDL, VLDL, LDLCALC, LDLDIRECT  Other Studies Reviewed Today:   Assessment and Plan:  Chest pain - Likely noncardiac. - Chest pain lasts for about 30 seconds to a minute, occurs at rest and occasionally with exertion.  Sharp and stabbing in quality. - Extremely physically active at baseline.  Does yard work, household chores and other exertional activities with no symptoms of typical angina. - Discussed symptoms of CAD and MI with the patient. - Heart healthy diet and exercise recommendations provided. - ER precautions for chest pain provided. - No further cardiac workup at this time.  Cold feet - Dorsalis pedis palpable on bilateral feet. - Obtain ABIs to evaluate for toe brachial index. - Reports having cramps in bilateral legs when he walks sometimes.  Cardiac murmur - Systolic murmur, rule out aortic valve stenosis.  Obtain echocardiogram.    40 minutes spent in reviewing prior records, imaging, more than 3 labs, discussion of the above problems with the patient and documentation.  I  answered all his questions.     Medication Adjustments/Labs and Tests Ordered: Current medicines are reviewed at length with the patient today.  Concerns regarding medicines are outlined above.    Disposition:  Follow up pending results  Signed Estephania Licciardi Priya Karlynn Furrow, MD, 03/15/2024 3:09 PM    [facility] [address] [place], [place], [place] [address]

## 2024-03-15 NOTE — Patient Instructions (Addendum)
 Medication Instructions:  Your physician recommends that you continue on your current medications as directed. Please refer to the Current Medication list given to you today.   Labwork: None  Testing/Procedures: Your physician has requested that you have an echocardiogram. Echocardiography is a painless test that uses sound waves to create images of your heart. It provides your doctor with information about the size and shape of your heart and how well your heart's chambers and valves are working. This procedure takes approximately one hour. There are no restrictions for this procedure. Please do NOT wear cologne, perfume, aftershave, or lotions (deodorant is allowed). Please arrive 15 minutes prior to your appointment time.  Please note: We ask at that you not bring children with you during ultrasound (echo/ vascular) testing. Due to room size and safety concerns, children are not allowed in the ultrasound rooms during exams. Our front office staff cannot provide observation of children in our lobby area while testing is being conducted. An adult accompanying a patient to their appointment will only be allowed in the ultrasound room at the discretion of the ultrasound technician under special circumstances. We apologize for any inconvenience.  Your physician has requested that you have an ankle brachial index (ABI). During this test an ultrasound and blood pressure cuff are used to evaluate the arteries that supply the arms and legs with blood. Allow thirty minutes for this exam. There are no restrictions or special instructions.  Please note: We ask at that you not bring children with you during ultrasound (echo/ vascular) testing. Due to room size and safety concerns, children are not allowed in the ultrasound rooms during exams. Our front office staff cannot provide observation of children in our lobby area while testing is being conducted. An adult accompanying a patient to their appointment will  only be allowed in the ultrasound room at the discretion of the ultrasound technician under special circumstances. We apologize for any inconvenience.   Follow-Up: Your physician recommends that you schedule a follow-up appointment in: Pending Results   Any Other Special Instructions Will Be Listed Below (If Applicable). Thank you for choosing [facility]!     If you need a refill on your cardiac medications before your next appointment, please call your pharmacy.

## 2024-04-05 ENCOUNTER — Ambulatory Visit: Attending: Internal Medicine

## 2024-04-05 ENCOUNTER — Ambulatory Visit (INDEPENDENT_AMBULATORY_CARE_PROVIDER_SITE_OTHER)

## 2024-04-05 ENCOUNTER — Telehealth: Payer: Self-pay | Admitting: Internal Medicine

## 2024-04-05 DIAGNOSIS — R011 Cardiac murmur, unspecified: Secondary | ICD-10-CM | POA: Diagnosis not present

## 2024-04-05 DIAGNOSIS — I739 Peripheral vascular disease, unspecified: Secondary | ICD-10-CM

## 2024-04-05 DIAGNOSIS — R209 Unspecified disturbances of skin sensation: Secondary | ICD-10-CM

## 2024-04-05 NOTE — Telephone Encounter (Signed)
 Information updated

## 2024-04-05 NOTE — Telephone Encounter (Signed)
 Pt new primary Dr is Charmaine Heller

## 2024-04-06 DIAGNOSIS — Z1211 Encounter for screening for malignant neoplasm of colon: Secondary | ICD-10-CM | POA: Diagnosis not present

## 2024-04-06 DIAGNOSIS — Z79899 Other long term (current) drug therapy: Secondary | ICD-10-CM | POA: Diagnosis not present

## 2024-04-06 DIAGNOSIS — J329 Chronic sinusitis, unspecified: Secondary | ICD-10-CM | POA: Diagnosis not present

## 2024-04-06 DIAGNOSIS — Z8546 Personal history of malignant neoplasm of prostate: Secondary | ICD-10-CM | POA: Diagnosis not present

## 2024-04-06 DIAGNOSIS — F410 Panic disorder [episodic paroxysmal anxiety] without agoraphobia: Secondary | ICD-10-CM | POA: Diagnosis not present

## 2024-04-06 DIAGNOSIS — H9193 Unspecified hearing loss, bilateral: Secondary | ICD-10-CM | POA: Diagnosis not present

## 2024-04-06 DIAGNOSIS — F411 Generalized anxiety disorder: Secondary | ICD-10-CM | POA: Diagnosis not present

## 2024-04-06 DIAGNOSIS — G4709 Other insomnia: Secondary | ICD-10-CM | POA: Diagnosis not present

## 2024-04-06 LAB — VAS US ABI WITH/WO TBI
Left ABI: 0.7
Right ABI: 0.82

## 2024-04-09 ENCOUNTER — Ambulatory Visit: Payer: Self-pay | Admitting: Internal Medicine

## 2024-04-09 DIAGNOSIS — I739 Peripheral vascular disease, unspecified: Secondary | ICD-10-CM

## 2024-04-09 LAB — ECHOCARDIOGRAM COMPLETE
AR max vel: 1.78 cm2
AV Area VTI: 1.63 cm2
AV Area mean vel: 1.85 cm2
AV Mean grad: 11 mmHg
AV Peak grad: 20.8 mmHg
AV Vena cont: 0.6 cm
Ao pk vel: 2.28 m/s
Area-P 1/2: 2.03 cm2
Calc EF: 65.5 %
MV VTI: 1.79 cm2
P 1/2 time: 379 ms
S' Lateral: 3.1 cm
Single Plane A2C EF: 71.4 %
Single Plane A4C EF: 62.4 %

## 2024-04-09 MED ORDER — ASPIRIN 81 MG PO TBEC: 81.0000 mg | DELAYED_RELEASE_TABLET | Freq: Every day | ORAL | 5 refills | Status: AC

## 2024-04-09 MED ORDER — ROSUVASTATIN CALCIUM 20 MG PO TABS: 20.0000 mg | ORAL_TABLET | Freq: Every day | ORAL | 3 refills | Status: AC

## 2024-04-09 NOTE — Telephone Encounter (Signed)
-----   Message from Vishnu P [name] sent at 04/09/2024  3:51 PM EDT ----- Mild PAD in the RLE, moderate PAD in the LLE.  Abnormal toe brachial index bilaterally.  There is evidence of poor circulation in bilateral lower extremities.  Start aspirin  81 mg once daily,  increase rosuvastatin  from 10 mg to 20 mg nightly.  Goal LDL should be less than 70.  Strongly encourage smoking cessation.  Obtain ultrasound arterial Doppler lower extremities.  Schedule follow-up  in 6 months. ----- Message ----- From: Interface, [employer] Sent: 04/05/2024   4:06 PM EDT To: Vishnu P [name], MD

## 2024-04-09 NOTE — Telephone Encounter (Signed)
 The patient has been notified of the result and verbalized understanding.  All questions (if any) were answered. Advised patient will send schedulers message to call and make appointment. Medications sent in to requested pharmacy. Patient verbalized understanding    Littie CHRISTELLA Croak, CMA 04/09/2024 4:34 PM

## 2024-04-12 DIAGNOSIS — H906 Mixed conductive and sensorineural hearing loss, bilateral: Secondary | ICD-10-CM | POA: Diagnosis not present

## 2024-04-12 DIAGNOSIS — H7192 Unspecified cholesteatoma, left ear: Secondary | ICD-10-CM | POA: Diagnosis not present

## 2024-04-26 DIAGNOSIS — H7192 Unspecified cholesteatoma, left ear: Secondary | ICD-10-CM | POA: Diagnosis not present

## 2024-04-26 DIAGNOSIS — H748X2 Other specified disorders of left middle ear and mastoid: Secondary | ICD-10-CM | POA: Diagnosis not present

## 2024-04-26 DIAGNOSIS — Z885 Allergy status to narcotic agent status: Secondary | ICD-10-CM | POA: Diagnosis not present

## 2024-05-03 ENCOUNTER — Ambulatory Visit: Attending: Internal Medicine

## 2024-05-03 DIAGNOSIS — I739 Peripheral vascular disease, unspecified: Secondary | ICD-10-CM | POA: Diagnosis not present

## 2024-05-04 ENCOUNTER — Ambulatory Visit: Payer: Self-pay | Admitting: Internal Medicine

## 2024-05-07 ENCOUNTER — Encounter

## 2024-05-08 NOTE — Telephone Encounter (Signed)
-----   Message from Vishnu P [name] sent at 05/04/2024  2:07 PM EDT ----- Bilateral mid SFA occlusion is noted.  Continue current medications.  If his leg claudication worsens, we will need to start Pletal.  Otherwise please schedule follow-up appointment in 6 months. ----- Message ----- From: Interface, [employer] Sent: 05/03/2024   5:19 PM EDT To: Vishnu P [name], MD

## 2024-05-10 ENCOUNTER — Telehealth: Payer: Self-pay | Admitting: *Deleted

## 2024-05-10 ENCOUNTER — Encounter

## 2024-05-10 NOTE — Telephone Encounter (Signed)
 David White, CMA to [name], Vishnu P, MD     05/08/24  4:34 PM Patient is willing to try medication. He is having some pain but not all the time. Please let me know the dosage and how he needs to take it David Beckwith HERO, [facility]    05/08/24  4:33 PM Note ----- Message from Vishnu P [name] sent at 05/04/2024  2:07 PM EDT ----- Bilateral mid SFA occlusion is noted.  Continue current medications.  If his leg claudication worsens, we will need to start Pletal.  Otherwise please schedule follow-up appointment in 6 months. ----- Message ----- From: Interface, [employer] Sent: 05/03/2024   5:19 PM EDT To: Vishnu P [name], MD         [name], [name], MD to David Beckwith HERO, CMA VM   05/04/24  2:07 PM Result Note Bilateral mid SFA occlusion is noted.  Continue current medications.  If his leg claudication worsens, we will need to start Pletal.  Otherwise please schedule follow-up appointment in 6 months. VAS US  LOWER EXTREMITY ARTERIAL DUPLEX  This encounter is not signed. The conversation may still be ongoing.

## 2024-05-10 NOTE — Telephone Encounter (Signed)
 David White, Belisicia T routed this conversation to [employer], Belisicia T BW   05/10/24  3:45 PM Note Pt calling to f/u as to what pain medication he is take. Please advise

## 2024-05-10 NOTE — Telephone Encounter (Signed)
 Pt calling to f/u as to what pain medication he is take. Please advise

## 2024-05-11 MED ORDER — CILOSTAZOL 50 MG PO TABS: 50.0000 mg | ORAL_TABLET | Freq: Two times a day (BID) | ORAL | 6 refills | Status: DC

## 2024-05-11 NOTE — Telephone Encounter (Signed)
 Patient informed and verbalized understanding of plan.

## 2024-05-17 DIAGNOSIS — H906 Mixed conductive and sensorineural hearing loss, bilateral: Secondary | ICD-10-CM | POA: Diagnosis not present

## 2024-05-17 DIAGNOSIS — H7192 Unspecified cholesteatoma, left ear: Secondary | ICD-10-CM | POA: Diagnosis not present

## 2024-05-17 DIAGNOSIS — H7291 Unspecified perforation of tympanic membrane, right ear: Secondary | ICD-10-CM | POA: Diagnosis not present

## 2024-05-31 DIAGNOSIS — L400 Psoriasis vulgaris: Secondary | ICD-10-CM | POA: Diagnosis not present

## 2024-06-01 NOTE — Telephone Encounter (Signed)
-----   Message from David White sent at 06/01/2024  4:04 PM EST ----- Pletal  50 mg is the starter dose. It acts only for 12 hours and hence twice daily dosing. If he is not noticing any difference in his leg claudication, then need to go up on Pletal  50 mg to 100 mg  BID.  No interactions with other heart medications.  Before any invasive procedures, this medication should be held for 5-7 days before the procedure and resume after the procedure. ----- Message ----- From: David White HERO, CMA Sent: 05/30/2024   5:20 PM EST To: [name] [name], MD  Patient stated that he has started medication, but has only been taking it once daily. He is concerned if ts ok to take with his heart conditions and the side effects. Does he really need to take  twice a day? Concerned about getting dizzy as well. When he takes just the one he really cannot tell due to he does work 12 hours a day. Please advise ----- Message ----- From: Stacia [name], MD Sent: 05/30/2024   1:30 PM EST To: White HERO David, CMA  I prolly replied to this message already in a separate thread. P.o Pletal  50 mg BID. ----- Message ----- From: David White HERO, CMA Sent: 05/08/2024   4:34 PM EST To: David P [name], MD  Patient is willing to try medication. He is having some pain but not all the time. Please let me know the dosage and how he needs to take it ----- Message ----- From: Stacia [name], MD Sent: 05/04/2024   2:07 PM EDT To: White HERO David, CMA  Bilateral mid SFA occlusion is noted.  Continue current medications.  If his leg claudication worsens, we will need to start Pletal .  Otherwise please schedule follow-up appointment in 6 months. ----- Message ----- From: Interface, [employer] Sent: 05/03/2024   5:19 PM EDT To: David P [name], MD

## 2024-06-01 NOTE — Telephone Encounter (Signed)
 Per Dr. [name]:  Pletal  50 mg is the starter dose. It acts only for 12 hours and hence twice daily dosing. If he is not noticing any difference in his leg claudication, then need to go up on Pletal  50 mg to 100 mg BID.   No interactions with other heart medications.   Before any invasive procedures, this medication should be held for 5-7 days before the procedure and resume after the procedure.  Advised patient of Dr. Alissa recommendations and patient verbalized understanding

## 2024-06-13 ENCOUNTER — Telehealth: Payer: Self-pay | Admitting: Internal Medicine

## 2024-06-13 DIAGNOSIS — R195 Other fecal abnormalities: Secondary | ICD-10-CM

## 2024-06-13 MED ORDER — CILOSTAZOL 50 MG PO TABS: 50.0000 mg | ORAL_TABLET | Freq: Every day | ORAL | Status: AC

## 2024-06-13 NOTE — Telephone Encounter (Signed)
 Notified via detailed voice messsage.    Lab order faxed to [facility] on [address] in [place] as the one on [address] has closed.

## 2024-06-13 NOTE — Telephone Encounter (Signed)
 Pt c/o medication issue:  1. Name of Medication: cilostazol  (PLETAL ) 50 MG tablet   2. How are you currently taking this medication (dosage and times per day)? As written  3. Are you having a reaction (difficulty breathing--STAT)? no  4. What is your medication issue?  Pt states above medication is causing him to have bad stools. Please advise.

## 2024-06-13 NOTE — Telephone Encounter (Signed)
 Patient c/o having stomach pain that wakes him in the middle of the night.  Has to go to bathroom with upset stomach.  Having diarrhea & very dark black stools x 4 days now.  No c/o N/V & no issues with hemorrhoids currently.  His Pletal  was recently increased to twice a day & did not seem to have this problem when he was taking only daily.

## 2024-06-13 NOTE — Telephone Encounter (Signed)
 Clarified dosing with the patient - states that when he initially started the Pletal , he started at 50mg  daily & did this about a week before going up to the twice a day dosing.  Been doing twice a day x 2-3 weeks, but only took one yesterday due to stomach issues.  Has not been increased to the 100mg  yet.  Patient very concerned about increasing due to side effects.   Will send order for CBC to [facility] - [address] - he will go tomorrow.

## 2024-06-14 DIAGNOSIS — R195 Other fecal abnormalities: Secondary | ICD-10-CM | POA: Diagnosis not present

## 2024-06-14 LAB — CBC
Hematocrit: 39.4 % (ref 37.5–51.0)
Hemoglobin: 13 g/dL (ref 13.0–17.7)
MCH: 31.9 pg (ref 26.6–33.0)
MCHC: 33 g/dL (ref 31.5–35.7)
MCV: 97 fL (ref 79–97)
Platelets: 307 x10E3/uL (ref 150–450)
RBC: 4.08 x10E6/uL — ABNORMAL LOW (ref 4.14–5.80)
RDW: 12.7 % (ref 11.6–15.4)
WBC: 9.7 x10E3/uL (ref 3.4–10.8)

## 2024-06-21 ENCOUNTER — Ambulatory Visit: Payer: Self-pay | Admitting: Internal Medicine

## 2024-06-25 NOTE — Telephone Encounter (Signed)
 Pt returned call to f/u please advise

## 2024-06-25 NOTE — Telephone Encounter (Signed)
-----   Message from Hamilton Bergeron sent at 06/25/2024  8:48 AM EST -----

## 2024-06-25 NOTE — Telephone Encounter (Signed)
 Lab results discussed with patient and he will re-start Pletal  50 mg twice a day

## 2024-11-30 ENCOUNTER — Other Ambulatory Visit

## 2024-12-07 ENCOUNTER — Ambulatory Visit: Admitting: Urology
